# Patient Record
Sex: Male | Born: 1950 | Race: White | Hispanic: No | Smoking: Never smoker
Health system: Southern US, Community
[De-identification: ages and names within clinical notes are randomized; demographics above are authoritative.]

## PROBLEM LIST (undated history)

## (undated) DIAGNOSIS — R0602 Shortness of breath: Secondary | ICD-10-CM

## (undated) DIAGNOSIS — D649 Anemia, unspecified: Secondary | ICD-10-CM

## (undated) DIAGNOSIS — I499 Cardiac arrhythmia, unspecified: Secondary | ICD-10-CM

## (undated) DIAGNOSIS — F32A Depression, unspecified: Secondary | ICD-10-CM

## (undated) DIAGNOSIS — K219 Gastro-esophageal reflux disease without esophagitis: Secondary | ICD-10-CM

## (undated) DIAGNOSIS — I509 Heart failure, unspecified: Secondary | ICD-10-CM

## (undated) DIAGNOSIS — J449 Chronic obstructive pulmonary disease, unspecified: Secondary | ICD-10-CM

## (undated) DIAGNOSIS — M797 Fibromyalgia: Secondary | ICD-10-CM

## (undated) DIAGNOSIS — E039 Hypothyroidism, unspecified: Secondary | ICD-10-CM

## (undated) DIAGNOSIS — F329 Major depressive disorder, single episode, unspecified: Secondary | ICD-10-CM

## (undated) DIAGNOSIS — Z8719 Personal history of other diseases of the digestive system: Secondary | ICD-10-CM

## (undated) DIAGNOSIS — R51 Headache: Secondary | ICD-10-CM

## (undated) DIAGNOSIS — B20 Human immunodeficiency virus [HIV] disease: Secondary | ICD-10-CM

## (undated) DIAGNOSIS — Z5189 Encounter for other specified aftercare: Secondary | ICD-10-CM

## (undated) HISTORY — PX: HERNIA REPAIR: SHX51

## (undated) HISTORY — PX: APPENDECTOMY: SHX54

## (undated) HISTORY — PX: ABDOMINAL SURGERY: SHX537

## (undated) HISTORY — PX: BELOW KNEE LEG AMPUTATION: SUR23

## (undated) HISTORY — PX: CHOLECYSTECTOMY: SHX55

## (undated) HISTORY — PX: JOINT REPLACEMENT: SHX530

---

## 2012-09-05 HISTORY — PX: NISSEN FUNDOPLICATION: SHX2091

## 2013-06-07 ENCOUNTER — Inpatient Hospital Stay (HOSPITAL_COMMUNITY)
Admission: EM | Admit: 2013-06-07 | Discharge: 2013-06-13 | DRG: 974 | Disposition: A | Discharge status: Home / Self Care | Payer: Medicare Other | Attending: Internal Medicine | Admitting: Internal Medicine

## 2013-06-07 ENCOUNTER — Emergency Department (HOSPITAL_COMMUNITY): Payer: Medicare Other

## 2013-06-07 ENCOUNTER — Encounter (HOSPITAL_COMMUNITY): Payer: Self-pay | Admitting: Emergency Medicine

## 2013-06-07 DIAGNOSIS — I4891 Unspecified atrial fibrillation: Secondary | ICD-10-CM | POA: Diagnosis present

## 2013-06-07 DIAGNOSIS — S88119A Complete traumatic amputation at level between knee and ankle, unspecified lower leg, initial encounter: Secondary | ICD-10-CM

## 2013-06-07 DIAGNOSIS — I509 Heart failure, unspecified: Secondary | ICD-10-CM | POA: Diagnosis present

## 2013-06-07 DIAGNOSIS — F329 Major depressive disorder, single episode, unspecified: Secondary | ICD-10-CM | POA: Diagnosis present

## 2013-06-07 DIAGNOSIS — J690 Pneumonitis due to inhalation of food and vomit: Secondary | ICD-10-CM | POA: Diagnosis present

## 2013-06-07 DIAGNOSIS — J9600 Acute respiratory failure, unspecified whether with hypoxia or hypercapnia: Secondary | ICD-10-CM | POA: Diagnosis present

## 2013-06-07 DIAGNOSIS — F3289 Other specified depressive episodes: Secondary | ICD-10-CM | POA: Diagnosis present

## 2013-06-07 DIAGNOSIS — J189 Pneumonia, unspecified organism: Secondary | ICD-10-CM | POA: Diagnosis present

## 2013-06-07 DIAGNOSIS — Z79899 Other long term (current) drug therapy: Secondary | ICD-10-CM

## 2013-06-07 DIAGNOSIS — D649 Anemia, unspecified: Secondary | ICD-10-CM | POA: Diagnosis present

## 2013-06-07 DIAGNOSIS — K219 Gastro-esophageal reflux disease without esophagitis: Secondary | ICD-10-CM | POA: Diagnosis present

## 2013-06-07 DIAGNOSIS — I442 Atrioventricular block, complete: Secondary | ICD-10-CM | POA: Diagnosis present

## 2013-06-07 DIAGNOSIS — B20 Human immunodeficiency virus [HIV] disease: Principal | ICD-10-CM | POA: Diagnosis present

## 2013-06-07 DIAGNOSIS — Z7982 Long term (current) use of aspirin: Secondary | ICD-10-CM

## 2013-06-07 DIAGNOSIS — E876 Hypokalemia: Secondary | ICD-10-CM | POA: Diagnosis present

## 2013-06-07 DIAGNOSIS — Z21 Asymptomatic human immunodeficiency virus [HIV] infection status: Secondary | ICD-10-CM | POA: Diagnosis present

## 2013-06-07 DIAGNOSIS — A419 Sepsis, unspecified organism: Secondary | ICD-10-CM | POA: Diagnosis present

## 2013-06-07 HISTORY — DX: Hypothyroidism, unspecified: E03.9

## 2013-06-07 HISTORY — DX: Chronic obstructive pulmonary disease, unspecified: J44.9

## 2013-06-07 HISTORY — DX: Anemia, unspecified: D64.9

## 2013-06-07 HISTORY — DX: Depression, unspecified: F32.A

## 2013-06-07 HISTORY — DX: Encounter for other specified aftercare: Z51.89

## 2013-06-07 HISTORY — DX: Heart failure, unspecified: I50.9

## 2013-06-07 HISTORY — DX: Major depressive disorder, single episode, unspecified: F32.9

## 2013-06-07 HISTORY — DX: Cardiac arrhythmia, unspecified: I49.9

## 2013-06-07 HISTORY — DX: Shortness of breath: R06.02

## 2013-06-07 HISTORY — DX: Gastro-esophageal reflux disease without esophagitis: K21.9

## 2013-06-07 HISTORY — DX: Headache: R51

## 2013-06-07 HISTORY — DX: Human immunodeficiency virus (HIV) disease: B20

## 2013-06-07 HISTORY — DX: Fibromyalgia: M79.7

## 2013-06-07 HISTORY — DX: Personal history of other diseases of the digestive system: Z87.19

## 2013-06-07 LAB — PRO B NATRIURETIC PEPTIDE: PRO B NATRI PEPTIDE: 121.5 pg/mL (ref 0–125)

## 2013-06-07 LAB — CBC
HEMATOCRIT: 33.3 % — AB (ref 39.0–52.0)
HEMATOCRIT: 28.6 % — AB (ref 39.0–52.0)
HEMOGLOBIN: 10.6 g/dL — AB (ref 13.0–17.0)
HEMOGLOBIN: 9.1 g/dL — AB (ref 13.0–17.0)
MCH: 27.2 pg (ref 26.0–34.0)
MCH: 27.3 pg (ref 26.0–34.0)
MCHC: 31.8 g/dL (ref 30.0–36.0)
MCHC: 31.8 g/dL (ref 30.0–36.0)
MCV: 85.6 fL (ref 78.0–100.0)
MCV: 85.9 fL (ref 78.0–100.0)
Platelets: 173 10*3/uL (ref 150–400)
Platelets: 162 10*3/uL (ref 150–400)
RBC: 3.89 MIL/uL — ABNORMAL LOW (ref 4.22–5.81)
RBC: 3.33 MIL/uL — ABNORMAL LOW (ref 4.22–5.81)
RDW: 14.7 % (ref 11.5–15.5)
RDW: 14.7 % (ref 11.5–15.5)
WBC: 10.7 10*3/uL — ABNORMAL HIGH (ref 4.0–10.5)
WBC: 10.5 10*3/uL (ref 4.0–10.5)

## 2013-06-07 LAB — URINALYSIS, ROUTINE W REFLEX MICROSCOPIC
BILIRUBIN URINE: NEGATIVE
Glucose, UA: NEGATIVE mg/dL
KETONES UR: NEGATIVE mg/dL
LEUKOCYTES UA: NEGATIVE
Nitrite: NEGATIVE
PH: 5.5 (ref 5.0–8.0)
Protein, ur: NEGATIVE mg/dL
Specific Gravity, Urine: 1.012 (ref 1.005–1.030)
Urobilinogen, UA: 0.2 mg/dL (ref 0.0–1.0)

## 2013-06-07 LAB — POCT I-STAT, CHEM 8
BUN: 15 mg/dL (ref 6–23)
CHLORIDE: 100 meq/L (ref 96–112)
CREATININE: 1.1 mg/dL (ref 0.50–1.35)
Calcium, Ion: 1.23 mmol/L (ref 1.13–1.30)
Glucose, Bld: 119 mg/dL — ABNORMAL HIGH (ref 70–99)
HCT: 34 % — ABNORMAL LOW (ref 39.0–52.0)
Hemoglobin: 11.6 g/dL — ABNORMAL LOW (ref 13.0–17.0)
Potassium: 4.2 mEq/L (ref 3.7–5.3)
Sodium: 136 mEq/L — ABNORMAL LOW (ref 137–147)
TCO2: 23 mmol/L (ref 0–100)

## 2013-06-07 LAB — BASIC METABOLIC PANEL
BUN: 15 mg/dL (ref 6–23)
CALCIUM: 8.7 mg/dL (ref 8.4–10.5)
CO2: 24 meq/L (ref 19–32)
Chloride: 101 mEq/L (ref 96–112)
Creatinine, Ser: 1.05 mg/dL (ref 0.50–1.35)
GFR calc Af Amer: 87 mL/min — ABNORMAL LOW (ref >90)
GFR, EST NON AFRICAN AMERICAN: 75 mL/min — AB (ref >90)
GLUCOSE: 112 mg/dL — AB (ref 70–99)
Potassium: 4.5 mEq/L (ref 3.7–5.3)
Sodium: 138 mEq/L (ref 137–147)

## 2013-06-07 LAB — INFLUENZA PANEL BY PCR (TYPE A & B)
H1N1FLUPCR: NOT DETECTED
Influenza A By PCR: NEGATIVE
Influenza B By PCR: NEGATIVE

## 2013-06-07 LAB — STREP PNEUMONIAE URINARY ANTIGEN: STREP PNEUMO URINARY ANTIGEN: NEGATIVE

## 2013-06-07 LAB — CG4 I-STAT (LACTIC ACID): Lactic Acid, Venous: 0.63 mmol/L (ref 0.5–2.2)

## 2013-06-07 LAB — CREATININE, SERUM: Creatinine, Ser: 0.85 mg/dL (ref 0.50–1.35)

## 2013-06-07 LAB — TROPONIN I: Troponin I: <0.3 ng/mL (ref <0.30)

## 2013-06-07 LAB — POCT I-STAT TROPONIN I: Troponin i, poc: 0 ng/mL (ref 0.00–0.08)

## 2013-06-07 LAB — LACTATE DEHYDROGENASE: LDH: 201 U/L (ref 94–250)

## 2013-06-07 LAB — URINE MICROSCOPIC-ADD ON

## 2013-06-07 MED ORDER — PIPERACILLIN-TAZOBACTAM 3.375 G IVPB
3.3750 g | Freq: Three times a day (TID) | INTRAVENOUS | Status: DC
  Filled 2013-06-07 (×2): qty 50

## 2013-06-07 MED ORDER — PIPERACILLIN-TAZOBACTAM 3.375 G IVPB: 3.3750 g | INTRAVENOUS | Status: DC

## 2013-06-07 MED ORDER — MORPHINE SULFATE 2 MG/ML IJ SOLN
2.0000 mg | INTRAMUSCULAR | Status: DC | PRN
  Administered 2013-06-09 – 2013-06-13 (×10): 2 mg via INTRAVENOUS
  Filled 2013-06-07 (×11): qty 1

## 2013-06-07 MED ORDER — SODIUM CHLORIDE 0.9 % IV SOLN: INTRAVENOUS | Status: DC

## 2013-06-07 MED ORDER — DEXTROSE 5 % IV SOLN
500.0000 mg | INTRAVENOUS | Status: DC
  Administered 2013-06-07 – 2013-06-09 (×3): 500 mg via INTRAVENOUS
  Filled 2013-06-07 (×5): qty 500

## 2013-06-07 MED ORDER — FLUOXETINE HCL 20 MG PO CAPS
40.0000 mg | ORAL_CAPSULE | Freq: Two times a day (BID) | ORAL | Status: DC
  Administered 2013-06-07 – 2013-06-13 (×11): 40 mg via ORAL
  Filled 2013-06-07 (×13): qty 2

## 2013-06-07 MED ORDER — EMTRICITABINE-TENOFOVIR DF 200-300 MG PO TABS
1.0000 | ORAL_TABLET | Freq: Every day | ORAL | Status: DC
  Administered 2013-06-07 – 2013-06-13 (×6): 1 via ORAL
  Filled 2013-06-07 (×8): qty 1

## 2013-06-07 MED ORDER — HYDROMORPHONE HCL PF 1 MG/ML IJ SOLN
1.0000 mg | Freq: Once | INTRAMUSCULAR | Status: AC
  Administered 2013-06-07: 1 mg via INTRAVENOUS
  Filled 2013-06-07: qty 1

## 2013-06-07 MED ORDER — VANCOMYCIN HCL IN DEXTROSE 1-5 GM/200ML-% IV SOLN
1000.0000 mg | Freq: Two times a day (BID) | INTRAVENOUS | Status: DC
  Filled 2013-06-07: qty 200

## 2013-06-07 MED ORDER — OXYCODONE HCL 5 MG PO TABS
15.0000 mg | ORAL_TABLET | Freq: Four times a day (QID) | ORAL | Status: DC | PRN
  Administered 2013-06-07 – 2013-06-13 (×14): 15 mg via ORAL
  Filled 2013-06-07 (×16): qty 3

## 2013-06-07 MED ORDER — LEVOTHYROXINE SODIUM 50 MCG PO TABS
50.0000 ug | ORAL_TABLET | Freq: Every day | ORAL | Status: DC
  Administered 2013-06-08 – 2013-06-13 (×5): 50 ug via ORAL
  Filled 2013-06-07 (×7): qty 1

## 2013-06-07 MED ORDER — PANTOPRAZOLE SODIUM 40 MG PO TBEC
40.0000 mg | DELAYED_RELEASE_TABLET | Freq: Two times a day (BID) | ORAL | Status: DC
  Administered 2013-06-07 – 2013-06-13 (×11): 40 mg via ORAL
  Filled 2013-06-07 (×11): qty 1

## 2013-06-07 MED ORDER — ENOXAPARIN SODIUM 40 MG/0.4ML ~~LOC~~ SOLN
40.0000 mg | SUBCUTANEOUS | Status: DC
  Administered 2013-06-07 – 2013-06-12 (×6): 40 mg via SUBCUTANEOUS
  Filled 2013-06-07 (×7): qty 0.4

## 2013-06-07 MED ORDER — ASPIRIN 325 MG PO TABS
325.0000 mg | ORAL_TABLET | Freq: Every day | ORAL | Status: DC
  Administered 2013-06-07 – 2013-06-13 (×6): 325 mg via ORAL
  Filled 2013-06-07 (×7): qty 1

## 2013-06-07 MED ORDER — BUPROPION HCL 100 MG PO TABS
100.0000 mg | ORAL_TABLET | Freq: Every day | ORAL | Status: DC
  Administered 2013-06-09 – 2013-06-13 (×5): 100 mg via ORAL
  Filled 2013-06-07 (×6): qty 1

## 2013-06-07 MED ORDER — SUCRALFATE 1 GM/10ML PO SUSP
1.0000 g | Freq: Three times a day (TID) | ORAL | Status: DC
Start: 2013-06-07 — End: 2013-06-13
  Administered 2013-06-07 – 2013-06-13 (×22): 1 g via ORAL
  Filled 2013-06-07 (×30): qty 10

## 2013-06-07 MED ORDER — FLUDROCORTISONE ACETATE 0.1 MG PO TABS
0.1000 mg | ORAL_TABLET | Freq: Every day | ORAL | Status: DC
  Administered 2013-06-07 – 2013-06-13 (×6): 0.1 mg via ORAL
  Filled 2013-06-07 (×7): qty 1

## 2013-06-07 MED ORDER — OXYBUTYNIN CHLORIDE ER 10 MG PO TB24
10.0000 mg | ORAL_TABLET | Freq: Two times a day (BID) | ORAL | Status: DC
  Administered 2013-06-07 – 2013-06-13 (×12): 10 mg via ORAL
  Filled 2013-06-07 (×13): qty 1

## 2013-06-07 MED ORDER — SODIUM CHLORIDE 0.9 % IV BOLUS (SEPSIS)
2000.0000 mL | Freq: Once | INTRAVENOUS | Status: AC
  Administered 2013-06-07: 1000 mL via INTRAVENOUS

## 2013-06-07 MED ORDER — TAMSULOSIN HCL 0.4 MG PO CAPS
0.4000 mg | ORAL_CAPSULE | Freq: Two times a day (BID) | ORAL | Status: DC
  Administered 2013-06-07 – 2013-06-13 (×11): 0.4 mg via ORAL
  Filled 2013-06-07 (×13): qty 1

## 2013-06-07 MED ORDER — PIPERACILLIN-TAZOBACTAM 3.375 G IVPB
3.3750 g | Freq: Three times a day (TID) | INTRAVENOUS | Status: DC
  Administered 2013-06-07 – 2013-06-13 (×17): 3.375 g via INTRAVENOUS
  Filled 2013-06-07 (×25): qty 50

## 2013-06-07 MED ORDER — RALTEGRAVIR POTASSIUM 400 MG PO TABS
400.0000 mg | ORAL_TABLET | Freq: Two times a day (BID) | ORAL | Status: DC
  Administered 2013-06-07 – 2013-06-13 (×12): 400 mg via ORAL
  Filled 2013-06-07 (×13): qty 1

## 2013-06-07 MED ORDER — METOCLOPRAMIDE HCL 5 MG PO TABS
5.0000 mg | ORAL_TABLET | Freq: Two times a day (BID) | ORAL | Status: DC
  Administered 2013-06-07 – 2013-06-13 (×11): 5 mg via ORAL
  Filled 2013-06-07 (×13): qty 1

## 2013-06-07 MED ORDER — VANCOMYCIN HCL 10 G IV SOLR
1500.0000 mg | Freq: Once | INTRAVENOUS | Status: DC
  Administered 2013-06-07: 1500 mg via INTRAVENOUS
  Filled 2013-06-07: qty 1500

## 2013-06-07 MED ORDER — TRAZODONE HCL 100 MG PO TABS
100.0000 mg | ORAL_TABLET | Freq: Every day | ORAL | Status: DC
  Administered 2013-06-07 – 2013-06-12 (×6): 100 mg via ORAL
  Filled 2013-06-07 (×7): qty 1

## 2013-06-07 MED ORDER — QUETIAPINE FUMARATE 100 MG PO TABS
100.0000 mg | ORAL_TABLET | Freq: Every day | ORAL | Status: DC
  Administered 2013-06-07 – 2013-06-12 (×6): 100 mg via ORAL
  Filled 2013-06-07 (×7): qty 1

## 2013-06-07 MED ORDER — PREGABALIN 25 MG PO CAPS
75.0000 mg | ORAL_CAPSULE | Freq: Two times a day (BID) | ORAL | Status: DC
  Administered 2013-06-07 – 2013-06-13 (×11): 75 mg via ORAL
  Filled 2013-06-07 (×4): qty 1
  Filled 2013-06-07: qty 3
  Filled 2013-06-07 (×11): qty 1

## 2013-06-07 MED ORDER — SODIUM CHLORIDE 0.9 % IV SOLN
INTRAVENOUS | Status: DC
  Administered 2013-06-07: 23:00:00 via INTRAVENOUS

## 2013-06-07 NOTE — ED Notes (Signed)
MD at bedside. 

## 2013-06-07 NOTE — ED Notes (Addendum)
Report given to Tera Partridge4E Troyce, RN

## 2013-06-07 NOTE — ED Notes (Signed)
Per lab ordered I-Stat 8 because working on machine.

## 2013-06-07 NOTE — Progress Notes (Signed)
Pt arrived from ED per stretcher accompanied by tech and mother VS done and placed on telemetry monitor. Pt oriented to room. Aware sputum and urine needed for send out.

## 2013-06-07 NOTE — Progress Notes (Signed)
ANTIBIOTIC CONSULT NOTE - INITIAL  Pharmacy Consult:  Vancomycin / Zosyn Indication:  PNA  Allergies  Allergen Reactions  . Bee Venom     anaphalaxis    Patient Measurements: Height: 5\' 10"  (177.8 cm) Weight: 180 lb (81.647 kg) IBW/kg (Calculated) : 73  Vital Signs: Temp: 99.1 F (37.3 C) (01/01 1226) Temp src: Oral (01/01 1226) BP: 90/63 mmHg (01/01 1226) Pulse Rate: 97 (01/01 1226)  Labs:  Recent Labs  06/07/13 1232 06/07/13 1250  WBC 10.7*  --   HGB 10.6* 11.6*  PLT 173  --   CREATININE  --  1.10   Estimated Creatinine Clearance: 73.7 ml/min (by C-G formula based on Cr of 1.1). No results found for this basename: VANCOTROUGH, VANCOPEAK, VANCORANDOM, GENTTROUGH, GENTPEAK, GENTRANDOM, TOBRATROUGH, TOBRAPEAK, TOBRARND, AMIKACINPEAK, AMIKACINTROU, AMIKACIN,  in the last 72 hours   Microbiology: No results found for this or any previous visit (from the past 720 hour(s)).  Medical History: Past Medical History  Diagnosis Date  . AIDS   . CHF (congestive heart failure)   . Blood transfusion without reported diagnosis   . Depression       Assessment: 89 YOM with history of HIV/AIDs presented with SOB s/p Levaquin treatment x 2 days.  Pharmacy consulted to manage vancomycin and Zosyn for PNA.  Baseline labs reviewed.   Goal of Therapy:  Vancomycin trough level 15-20 mcg/ml   Plan:  - Vanc 1500mg  IV x 1, then 1gm IV Q12H - Zosyn 3.375gm IV Q8H, 4 hr infusion - Monitor renal fxn, clinical course, vanc trough as indicated - F/U the need for empiric Bactrim    Pace Lamadrid D. Laney Potashang, PharmD, BCPS Pager:  [phone] 06/07/2013, 1:50 PM

## 2013-06-07 NOTE — Progress Notes (Signed)
Call to IV team to notify for 2 nd IV site since pt is getting 3 IV antibiotics.

## 2013-06-07 NOTE — ED Notes (Addendum)
MD at bedside. Discussed admission w/ pt.

## 2013-06-07 NOTE — ED Provider Notes (Signed)
CSN: [id number]     Arrival date & time 06/07/13  1203 History   First MD Initiated Contact with Patient 06/07/13 1236     Chief Complaint  Patient presents with  . Shortness of Breath   (Consider location/radiation/quality/duration/timing/severity/associated sxs/prior Treatment) Patient is a 63 y.o. male presenting with shortness of breath.  Shortness of Breath  Pt with history of HIV/AIDS, last CD4 was >600 per patient about 2 months ago. Here visiting from [place]. Reports 2 days ago he began to have fever and productive cough. He has had aspiration pneumonia many times in the past and this feels similar. He reports fundoplication was done several months ago to help prevent aspiration but reports he had an aspiration event about a week ago. He also reports general malaise, myalgias and burning with urination.   Past Medical History  Diagnosis Date  . AIDS   . CHF (congestive heart failure)   . Blood transfusion without reported diagnosis   . Depression    Past Surgical History  Procedure Laterality Date  . Abdominal surgery    . Cholecystectomy    . Appendectomy    . Hernia repair    . Joint replacement    . Below knee leg amputation     History reviewed. No pertinent family history. History  Substance Use Topics  . Smoking status: Never Smoker   . Smokeless tobacco: Not on file  . Alcohol Use: No    Review of Systems  Respiratory: Positive for shortness of breath.    All other systems reviewed and are negative except as noted in HPI.   Allergies  Bee venom  Home Medications  No current outpatient prescriptions on file. BP 81/53  Pulse 82  Temp(Src) 99.1 F (37.3 C) (Oral)  Resp 18  Ht 5\' 10"  (1.778 m)  Wt 180 lb (81.647 kg)  BMI 25.83 kg/m2  SpO2 97% Physical Exam  Nursing note and vitals reviewed. Constitutional: He is oriented to person, place, and time. He appears well-developed and well-nourished.  HENT:  Head: Normocephalic and atraumatic.   Dry mouth  Eyes: EOM are normal. Pupils are equal, round, and reactive to light.  Neck: Normal range of motion. Neck supple.  Cardiovascular: Normal rate, normal heart sounds and intact distal pulses.   Pulmonary/Chest: Effort normal. He has no wheezes. He has rales (L base).  Abdominal: Bowel sounds are normal. He exhibits no distension. There is no tenderness.  Musculoskeletal: Normal range of motion. He exhibits no edema and no tenderness.  Neurological: He is alert and oriented to person, place, and time. He has normal strength. No cranial nerve deficit or sensory deficit.  Skin: Skin is warm and dry. No rash noted.  Psychiatric: He has a normal mood and affect.    ED Course  Procedures (including critical care time) Labs Review Labs Reviewed  CBC - Abnormal; Notable for the following:    WBC 10.7 (*)    RBC 3.89 (*)    Hemoglobin 10.6 (*)    HCT 33.3 (*)    All other components within normal limits  BASIC METABOLIC PANEL - Abnormal; Notable for the following:    Glucose, Bld 112 (*)    GFR calc non Af Amer 75 (*)    GFR calc Af Amer 87 (*)    All other components within normal limits  POCT I-STAT, CHEM 8 - Abnormal; Notable for the following:    Sodium 136 (*)    Glucose, Bld 119 (*)  Hemoglobin 11.6 (*)    HCT 34.0 (*)    All other components within normal limits  CULTURE, BLOOD (ROUTINE X 2)  CULTURE, BLOOD (ROUTINE X 2)  LACTATE DEHYDROGENASE  TROPONIN I  INFLUENZA PANEL BY PCR  URINALYSIS, ROUTINE W REFLEX MICROSCOPIC  POCT I-STAT TROPONIN I  CG4 I-STAT (LACTIC ACID)   Imaging Review Dg Chest 2 View  06/07/2013   CLINICAL DATA:  Fever and cough.  EXAM: CHEST  2 VIEW  COMPARISON:  None.  FINDINGS: Two views of the chest demonstrate mild elevation of the right hemidiaphragm. There are streaky densities at the left lung base which could an infectious process. Few densities near the right costophrenic angle may represent atelectasis or scarring. Surgical clips in  the right infrahilar region. There are streaky densities in the right superior hilar region that may represent overlying bone structures but nonspecific. No definite pleural effusions.  IMPRESSION: Streaky densities at the left lung base are concerning for an acute infectious or inflammatory process but difficult to exclude chronic changes at this location.  Postoperative changes in the right hemithorax with mild elevation of the right hemidiaphragm. Streaky densities at the right costophrenic angle and right suprahilar region are nonspecific.  Recommend short-term followup to evaluate for resolution of the densities at the left lung base. The patient may need a chest CT to follow the streaky densities in the right lung.   Electronically Signed   By: Richarda OverlieAdam  Henn M.D.   On: 06/07/2013 13:37    EKG Interpretation    Date/Time:  Thursday June 07 2013 12:08:00 EST Ventricular Rate:  107 PR Interval:  186 QRS Duration: 76 QT Interval:  324 QTC Calculation: 432 R Axis:   10 Text Interpretation:  Sinus tachycardia Nonspecific ST and T wave abnormality Abnormal ECG No significant change since last tracing Confirmed by SHELDON  MD, CHARLES ([id number]) on 06/07/2013 12:15:19 PM            MDM   1. CAP (community acquired pneumonia)   2. Sepsis    Pt meets criteria for sepsis with transient hypotension, no signs of shock. Plan Admission for CAP, not improving with Levaquin.     Charles B. Bernette MayersSheldon, MD 06/07/13 [phone]

## 2013-06-07 NOTE — ED Notes (Signed)
Pt reports fever to 103 this AM, SOB, prod cough states he has AIDs. PMD in [place]. Called in script for levaquin which patient has been taking for two days. Pt tachypneic. Sat 92% Ra.

## 2013-06-07 NOTE — H&P (Signed)
[facility] History and Physical  [name] WUJ:[id number] DOB: 09-Nov-1950 DOA: 06/07/2013  Referring physician:  PCP: No primary provider on file.   Chief Complaint: Cough/Shortness of Breath  HPI: [name] is a 63 y.o. male with a past medical history of HIV, last CD4 count greater than 600 per patient several months ago, history of severe reflux leading to aspiration pneumonias in the past, status post fundoplication procedure performed in [place] this year, who is currently in the area visiting his parents. He presents to the emergency department today with complaints of cough, fever reporting a temperature of 103 at home this morning, green sputum production, and shortness of breath which has progressively worsened over the past 4 days. He also complains of associated chest pain with a deep inspiration and cough, generalized weakness, malaise, fatigue, and overall doing poorly at home. His physicians in [place] called in a prescription for levofloxacin which he took prior to this presentation. Chest x-ray performed in the emergency department today showed streaky densities in the left lung base concerning for an acute infectious or inflammatory process.                                                                                                        Review of Systems:  Constitutional:  Positive for night sweats, Fevers, chills, fatigue.  HEENT:  No headaches, Difficulty swallowing,Tooth/dental problems,Sore throat,  No sneezing, itching, ear ache, nasal congestion, post nasal drip,  Cardio-vascular:  Orthopnea, PND, swelling in lower extremities, anasarca, dizziness, palpitations, positive for chest pain GI:  No heartburn, indigestion, abdominal pain, nausea, vomiting, diarrhea, change in bowel habits, loss of appetite  Resp:  Positive for shortness of breath with exertion and at rest. He reported excess mucus, productive cough, No coughing up of blood.No  change in color of mucus.No wheezing.No chest wall deformity  Skin:  no rash or lesions.  GU:  no dysuria, change in color of urine, no urgency or frequency. No flank pain.  Musculoskeletal:  No joint pain or swelling. No decreased range of motion. No back pain.  Psych:  No change in mood or affect. No depression or anxiety. No memory loss.   Past Medical History  Diagnosis Date  . AIDS   . CHF (congestive heart failure)   . Blood transfusion without reported diagnosis   . Depression    Past Surgical History  Procedure Laterality Date  . Abdominal surgery    . Cholecystectomy    . Appendectomy    . Hernia repair    . Joint replacement    . Below knee leg amputation     Social History:  reports that he has never smoked. He does not have any smokeless tobacco history on file. He reports that he does not drink alcohol or use illicit drugs.  Allergies  Allergen Reactions  . Bee Venom     anaphalaxis    History reviewed. No pertinent family history.   Prior to Admission medications   Medication Sig Start Date End Date Taking? Authorizing Provider  aspirin 325 MG tablet  Take 325 mg by mouth daily.   Yes Historical Provider, MD  B Complex-Biotin-FA (HM VITAMIN B100 COMPLEX PO) Take 1 tablet by mouth every morning.   Yes Historical Provider, MD  buPROPion (WELLBUTRIN) 100 MG tablet Take 100 mg by mouth every morning.   Yes Historical Provider, MD  clotrimazole-betamethasone (LOTRISONE) cream Apply 1 application topically as needed.   Yes Historical Provider, MD  diphenoxylate-atropine (LOMOTIL) 2.5-0.025 MG per tablet Take 1 tablet by mouth 3 (three) times daily as needed for diarrhea or loose stools (sometimes takes more than 3 times daily).   Yes Historical Provider, MD  emtricitabine-tenofovir (TRUVADA) 200-300 MG per tablet Take 1 tablet by mouth daily.   Yes Historical Provider, MD  EPINEPHrine (EPIPEN 2-PAK) 0.3 mg/0.3 mL SOAJ injection Inject 0.3 mg into the muscle once as  needed (anaphylaxis).   Yes Historical Provider, MD  fludrocortisone (FLORINEF) 0.1 MG tablet Take 0.1 mg by mouth daily.   Yes Historical Provider, MD  FLUoxetine (PROZAC) 40 MG capsule Take 40 mg by mouth 2 (two) times daily.   Yes Historical Provider, MD  folic acid (FOLVITE) 1 MG tablet Take 1 mg by mouth daily.   Yes Historical Provider, MD  furosemide (LASIX) 40 MG tablet Take 40 mg by mouth daily.   Yes Historical Provider, MD  GuaiFENesin (MUCINEX PO) Take 1 tablet by mouth daily. Chest congestion   Yes Historical Provider, MD  Ipratropium-Albuterol (COMBIVENT RESPIMAT) 20-100 MCG/ACT AERS respimat Inhale 1 puff into the lungs 4 (four) times daily.   Yes Historical Provider, MD  levofloxacin (LEVAQUIN) 500 MG tablet Take 500 mg by mouth daily.   Yes Historical Provider, MD  levothyroxine (SYNTHROID, LEVOTHROID) 50 MCG tablet Take 50 mcg by mouth daily before breakfast.   Yes Historical Provider, MD  loratadine (CLARITIN) 10 MG tablet Take 10 mg by mouth daily.   Yes Historical Provider, MD  Melatonin 3 MG TABS Take 1 tablet by mouth at bedtime.   Yes Historical Provider, MD  metoCLOPramide (REGLAN) 5 MG tablet Take 5 mg by mouth 2 (two) times daily.   Yes Historical Provider, MD  ondansetron (ZOFRAN) 4 MG tablet Take 8 mg by mouth every 8 (eight) hours as needed for nausea or vomiting.   Yes Historical Provider, MD  oxybutynin (DITROPAN-XL) 10 MG 24 hr tablet Take 10 mg by mouth 2 (two) times daily.   Yes Historical Provider, MD  OxyCODONE (OXYCONTIN) 80 mg T12A 12 hr tablet Take 80 mg by mouth 2 (two) times daily.   Yes Historical Provider, MD  oxyCODONE (ROXICODONE) 15 MG immediate release tablet Take 15 mg by mouth every 8 (eight) hours as needed for pain (breakthrough pain).   Yes Historical Provider, MD  pantoprazole (PROTONIX) 40 MG tablet Take 40 mg by mouth 2 (two) times daily.   Yes Historical Provider, MD  potassium chloride SA (K-DUR,KLOR-CON) 20 MEQ tablet Take 20 mEq by mouth 2  (two) times daily.   Yes Historical Provider, MD  pregabalin (LYRICA) 75 MG capsule Take 75 mg by mouth 2 (two) times daily.   Yes Historical Provider, MD  QUEtiapine (SEROQUEL) 100 MG tablet Take 100 mg by mouth at bedtime.   Yes Historical Provider, MD  raltegravir (ISENTRESS) 400 MG tablet Take 400 mg by mouth 2 (two) times daily.   Yes Historical Provider, MD  ranitidine (ZANTAC) 150 MG tablet Take 150 mg by mouth 2 (two) times daily.   Yes Historical Provider, MD  sucralfate (CARAFATE) 1 GM/10ML suspension Take  1 g by mouth 4 (four) times daily as needed.   Yes Historical Provider, MD  tamsulosin (FLOMAX) 0.4 MG CAPS capsule Take 0.4 mg by mouth 2 (two) times daily.   Yes Historical Provider, MD  traZODone (DESYREL) 100 MG tablet Take 100 mg by mouth at bedtime.   Yes Historical Provider, MD  Vitamin D, Ergocalciferol, (DRISDOL) 50000 UNITS CAPS capsule Take 50,000 Units by mouth every 7 (seven) days. Takes on fridays   Yes Historical Provider, MD   Physical Exam: Filed Vitals:   06/07/13 1430  BP: 109/73  Pulse: 80  Temp:   Resp:     BP 109/73  Pulse 80  Temp(Src) 99.1 F (37.3 C) (Oral)  Resp 18  Ht 5\' 10"  (1.778 m)  Wt 81.647 kg (180 lb)  BMI 25.83 kg/m2  SpO2 97%  General:  Ill-appearing, mild distress. Awake alert oriented, mentating well. Eyes: PERRL, normal lids, irises & conjunctiva ENT: grossly normal hearing, lips & tongue Neck: no LAD, masses or thyromegaly Cardiovascular: RRR, no m/r/g. No LE edema. Telemetry: SR, no arrhythmias  Respiratory: Extensive rhonchi over left hemithorax, positive crackles, I do not auscultate wheezing or rales. Abdomen: soft, ntnd Skin: no rash or induration seen on limited exam Musculoskeletal: Patient having prosthesis to right lower extremity, left lower extremity have an old scars over ankle. Psychiatric: grossly normal mood and affect, speech fluent and appropriate Neurologic: grossly non-focal.          Labs on Admission:   Basic Metabolic Panel:  Recent Labs Lab 06/07/13 1232 06/07/13 1250  NA 138 136*  K 4.5 4.2  CL 101 100  CO2 24  --   GLUCOSE 112* 119*  BUN 15 15  CREATININE 1.05 1.10  CALCIUM 8.7  --    Liver Function Tests: No results found for this basename: AST, ALT, ALKPHOS, BILITOT, PROT, ALBUMIN,  in the last 168 hours No results found for this basename: LIPASE, AMYLASE,  in the last 168 hours No results found for this basename: AMMONIA,  in the last 168 hours CBC:  Recent Labs Lab 06/07/13 1232 06/07/13 1250  WBC 10.7*  --   HGB 10.6* 11.6*  HCT 33.3* 34.0*  MCV 85.6  --   PLT 173  --    Cardiac Enzymes:  Recent Labs Lab 06/07/13 1239  TROPONINI <0.30    BNP (last 3 results) No results found for this basename: PROBNP,  in the last 8760 hours CBG: No results found for this basename: GLUCAP,  in the last 168 hours  Radiological Exams on Admission: Dg Chest 2 View  06/07/2013   CLINICAL DATA:  Fever and cough.  EXAM: CHEST  2 VIEW  COMPARISON:  None.  FINDINGS: Two views of the chest demonstrate mild elevation of the right hemidiaphragm. There are streaky densities at the left lung base which could an infectious process. Few densities near the right costophrenic angle may represent atelectasis or scarring. Surgical clips in the right infrahilar region. There are streaky densities in the right superior hilar region that may represent overlying bone structures but nonspecific. No definite pleural effusions.  IMPRESSION: Streaky densities at the left lung base are concerning for an acute infectious or inflammatory process but difficult to exclude chronic changes at this location.  Postoperative changes in the right hemithorax with mild elevation of the right hemidiaphragm. Streaky densities at the right costophrenic angle and right suprahilar region are nonspecific.  Recommend short-term followup to evaluate for resolution of the densities at  the left lung base. The patient may need  a chest CT to follow the streaky densities in the right lung.   Electronically Signed   By: Richarda Overlie M.D.   On: 06/07/2013 13:37    EKG: Independently reviewed. Sinus tachycardia   Assessment/Plan Active Problems:   Sepsis   CAP (community acquired pneumonia)   Aspiration pneumonia   HIV (human immunodeficiency virus infection)   Pneumonia   1. Sepsis, present on admission, evidenced by hypotension with a blood pressure of 81/53, respiratory rate of 24 and a pulse of 105. He reported having a temperature of 103 this morning. Source of infection likely community acquire pneumonia versus aspiration pneumonia given his history. Will obtain blood cultures and sputum cultures, place and on the pneumonia protocol, start empiric IV antibiotic therapy with Zosyn and azithromycin. Continue supportive care, IV fluid resuscitation. 2. Community acquire pneumonia versus aspiration pneumonia. Patient reporting a history of severe reflux which has led to multiple episodes of aspiration pneumonias in the past. Chest x-ray showing infiltrate involving left lower lung. This could also be secondary to a community acquire pneumonia. Will start empiric IV antibiotic therapy with Zosyn for expanded coverage in setting of aspiration pneumonia. I will also start azithromycin for atypical coverage. Followup on blood cultures and sputum cultures. 3. Respiratory failure. Evidence by respiratory of 24, patient's noted to be in respiratory distress and having labored breathing in the emergency department, likely secondary to pneumonia. Provide supplemental oxygen. Monitor closely overnight with telemetry.  4. HIV. He states having last CD4 count greater than 600 several months ago. Will continue anti-retroviral therapy with Truvada and Raltegravir.  5. Severe gastroesophageal reflux disease. Continue Protonix 40 mg by mouth twice a day and Carafate. Status post fundoplication.  6. Nutrition. Regular diet 7. DVT  prophylaxis. Lovenox  Code Status: Full Code Family Communication: I spoke with patient's mother present at bedside Disposition Plan: I anticipate he will require greater than 2 nights hospitalization  Time spent: 70 minutes  Jeralyn Bennett [facility] Pager [phone]

## 2013-06-07 NOTE — ED Notes (Signed)
Phlebotomy notified of Blood Cultures to be obtained. States will obtain cultures.

## 2013-06-07 NOTE — ED Notes (Signed)
Pt made aware of the bed assignment. Understands admission.

## 2013-06-07 NOTE — ED Notes (Signed)
Lab at bedside for cultures.

## 2013-06-07 NOTE — ED Notes (Addendum)
Consulted MD Renae GlossShelton about blood cultures. Orders to be entered.

## 2013-06-08 ENCOUNTER — Encounter (HOSPITAL_COMMUNITY): Payer: Self-pay | Admitting: General Practice

## 2013-06-08 DIAGNOSIS — I442 Atrioventricular block, complete: Secondary | ICD-10-CM

## 2013-06-08 DIAGNOSIS — I059 Rheumatic mitral valve disease, unspecified: Secondary | ICD-10-CM

## 2013-06-08 DIAGNOSIS — J189 Pneumonia, unspecified organism: Secondary | ICD-10-CM

## 2013-06-08 LAB — LEGIONELLA ANTIGEN, URINE: LEGIONELLA ANTIGEN, URINE: NEGATIVE

## 2013-06-08 LAB — BASIC METABOLIC PANEL
BUN: 10 mg/dL (ref 6–23)
CALCIUM: 8 mg/dL — AB (ref 8.4–10.5)
CO2: 21 meq/L (ref 19–32)
Chloride: 111 mEq/L (ref 96–112)
Creatinine, Ser: 0.8 mg/dL (ref 0.50–1.35)
GFR calc Af Amer: >90 mL/min (ref >90)
Glucose, Bld: 121 mg/dL — ABNORMAL HIGH (ref 70–99)
Potassium: 3.3 mEq/L — ABNORMAL LOW (ref 3.7–5.3)
SODIUM: 146 meq/L (ref 137–147)

## 2013-06-08 LAB — CORTISOL: CORTISOL PLASMA: 11.8 ug/dL

## 2013-06-08 LAB — CBC
HCT: 29.5 % — ABNORMAL LOW (ref 39.0–52.0)
Hemoglobin: 9.1 g/dL — ABNORMAL LOW (ref 13.0–17.0)
MCH: 26.8 pg (ref 26.0–34.0)
MCHC: 30.8 g/dL (ref 30.0–36.0)
MCV: 86.8 fL (ref 78.0–100.0)
PLATELETS: 151 10*3/uL (ref 150–400)
RBC: 3.4 MIL/uL — ABNORMAL LOW (ref 4.22–5.81)
RDW: 14.9 % (ref 11.5–15.5)
WBC: 5.8 10*3/uL (ref 4.0–10.5)

## 2013-06-08 LAB — MRSA PCR SCREENING: MRSA BY PCR: NEGATIVE

## 2013-06-08 LAB — TSH: TSH: 3.584 u[IU]/mL (ref 0.350–4.500)

## 2013-06-08 MED ORDER — POTASSIUM CHLORIDE IN NACL 20-0.9 MEQ/L-% IV SOLN
INTRAVENOUS | Status: DC
  Administered 2013-06-08: 100 mL via INTRAVENOUS
  Administered 2013-06-09 (×2): via INTRAVENOUS
  Administered 2013-06-09: 100 mL via INTRAVENOUS
  Filled 2013-06-08 (×6): qty 1000

## 2013-06-08 MED ORDER — ONDANSETRON HCL 4 MG/2ML IJ SOLN
4.0000 mg | Freq: Four times a day (QID) | INTRAMUSCULAR | Status: DC | PRN
  Administered 2013-06-08 – 2013-06-12 (×6): 4 mg via INTRAVENOUS
  Filled 2013-06-08 (×6): qty 2

## 2013-06-08 MED ORDER — GUAIFENESIN-DM 100-10 MG/5ML PO SYRP
5.0000 mL | ORAL_SOLUTION | ORAL | Status: DC | PRN
  Administered 2013-06-08 – 2013-06-09 (×2): 5 mL via ORAL
  Filled 2013-06-08 (×2): qty 5

## 2013-06-08 MED ORDER — BENZONATATE 100 MG PO CAPS
200.0000 mg | ORAL_CAPSULE | Freq: Three times a day (TID) | ORAL | Status: DC | PRN
  Filled 2013-06-08 (×3): qty 2

## 2013-06-08 NOTE — Progress Notes (Signed)
Pt. With BP of 87/52 and 92/59. On call MD, Maretta BeesJ. McClung, notified via text page. Pt. Also with constant, dry non-productive cough. Pt. Requesting cough medication. RN will continue to monitor. Henrietta Cieslewicz, Cheryll DessertKaren Cherrell

## 2013-06-08 NOTE — Progress Notes (Signed)
  Echocardiogram 2D Echocardiogram has been performed.  [name], [name] 06/08/2013, 4:54 PM

## 2013-06-08 NOTE — Progress Notes (Signed)
[facility]. Talked with pt after reviewing strips and chart. PA ready to leave the floor at 10:12 when pt began throwing up undigested food and went in CHB rate 39. Alert pt placed on pacing pads but not sustained. ECG tracing noted and saved in chart. Put in for transfer to higher level of care.

## 2013-06-08 NOTE — Progress Notes (Signed)
Call to Hospitalist Dr York SpanielBuriev to make aware of pt's condition asymptomatic as this time. Made aware of EKG results. MD called cardiology consult. Kept pt updated.
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
## 2013-06-08 NOTE — Consult Note (Signed)
ELECTROPHYSIOLOGY CONSULT NOTE    Referring Physician:  Dr Eden EmmsNishan  Admit Date: 06/07/2013  Reason for consultation:  Transient AV block  Heber CarolinaMichael Webb is a 63 y.o. male with a h/o HIV, prior CHF and Afib (he says in 2005 but not recently) who was admitted with pneumonia.  He reports progressive SOB and cough.  His cough has become quite productive and has lead to post tussive emesis on several occasions.  Early this am (5:30), he was observed to have transient AV block on telemetry.  He does not recall events related to this.  He is not sure if he was coughing and thinks that he may have been sleeping.  Subsequently, around 10am, he had another episode of transient AV block.  He is quite certain that he had a very forceful cough which lead to gagging and post tussive emesis.  This proceeded transient AV block.  He reports that after coughing/ gagging/ throwing up that nurses came running in to see if he was ok because of his transient av block.  He denies presyncope or syncope with this event.  He has a h/o severe GERD s/p Nissen fundoplication earlier this year.  He has a h/o postural dizziness chronically but denies presyncope or syncope. He is unaware of any other issues with AV block.  Past Medical History  Diagnosis Date  . AIDS   . CHF (congestive heart failure)   . Blood transfusion without reported diagnosis   . Depression   . Dysrhythmia     HX OF ATRIAL FIB   COMPLETE HEART BLOCK   06/2013  . Hypothyroidism   . COPD (chronic obstructive pulmonary disease)   . Shortness of breath   . GERD (gastroesophageal reflux disease)   . H/O hiatal hernia   . Headache(784.0)   . Fibromyalgia   . Anemia    Past Surgical History  Procedure Laterality Date  . Abdominal surgery    . Cholecystectomy    . Appendectomy    . Hernia repair    . Below knee leg amputation    . Joint replacement    . Nissen fundoplication  09/2012    . aspirin  325 mg Oral Daily  . azithromycin  500 mg  Intravenous Q24H  . buPROPion  100 mg Oral Daily  . emtricitabine-tenofovir  1 tablet Oral Daily  . enoxaparin (LOVENOX) injection  40 mg Subcutaneous Q24H  . fludrocortisone  0.1 mg Oral Daily  . FLUoxetine  40 mg Oral BID  . levothyroxine  50 mcg Oral QAC breakfast  . metoCLOPramide  5 mg Oral BID  . oxybutynin  10 mg Oral BID  . pantoprazole  40 mg Oral BID  . piperacillin-tazobactam (ZOSYN)  IV  3.375 g Intravenous Q8H  . pregabalin  75 mg Oral BID  . QUEtiapine  100 mg Oral QHS  . raltegravir  400 mg Oral BID  . sucralfate  1 g Oral TID WC & HS  . tamsulosin  0.4 mg Oral BID  . traZODone  100 mg Oral QHS   . 0.9 % NaCl with KCl 20 mEq / L 100 mL (06/08/13 1435)    Allergies  Allergen Reactions  . Bee Venom     anaphalaxis    History   Social History  . Marital Status: Divorced    Spouse Name: N/A    Number of Children: N/A  . Years of Education: N/A   Occupational History  . Not on file.   Social  History Main Topics  . Smoking status: Never Smoker   . Smokeless tobacco: Never Used  . Alcohol Use: No  . Drug Use: No  . Sexual Activity: No   Other Topics Concern  . Not on file   Social History Narrative  . No narrative on file    History reviewed. No pertinent family history.  ROS- All systems are reviewed and negative except as per the HPI above  Physical Exam: Telemetry: Filed Vitals:   06/08/13 0550 06/08/13 0703 06/08/13 1001 06/08/13 1333  BP:   97/66 93/52  Pulse:    81  Temp:   97.7 F (36.5 C) 97.8 F (36.6 C)  TempSrc:   Oral Oral  Resp:   20 20  Height:      Weight: 177 lb 12.8 oz (80.65 kg)     SpO2:  92% 97% 97%    GEN- The patient is overweight appearing, alert and oriented x 3 today.  Coughs frequently during the exam Head- normocephalic, atraumatic Eyes-  Sclera clear, conjunctiva pink Ears- hearing intact Oropharynx- clear Neck- supple  Lungs- diffuse rhonchii, few basilar rales, frequent productive cough with some  gagging during my encounter Heart- Regular rate and rhythm, 2/6 SEM at the apex GI- soft, NT, ND, + BS Extremities- no clubbing, cyanosis, or edema MS- s/p R BKA Skin- no rash or lesion Psych- euthymic mood, full affect Neuro- strength and sensation are intact  EKG 06/07/13- sinus tachycardia 107 bpm, PR 186 msec, Qtc 432 msec, nonspecific St/T changes  Labs:   Lab Results  Component Value Date   WBC 5.8 06/08/2013   HGB 9.1* 06/08/2013   HCT 29.5* 06/08/2013   MCV 86.8 06/08/2013   PLT 151 06/08/2013    Recent Labs Lab 06/08/13 0547  NA 146  K 3.3*  CL 111  CO2 21  BUN 10  CREATININE 0.80  CALCIUM 8.0*  GLUCOSE 121*   Radiology: cxr reviewed   Echo:  pending  ASSESSMENT AND PLAN:   1. Transient complete heart block I have reviewed his telemetry which reveals 2 episodes of transient AV block.  Though I have no clear history to guide from the 5:30 am event, he is very clear that his second event was in the setting of forceful cough/ gagging/ and vomiting.  This was almost certainly vagal in origin. During my encounter, he had very frequent and forceful coughing with some gagging and nausea.  I suspect that this is also the cause for his initial episode. At this point, I would recommend treatment of pneumonia/ cough by primary team.  Observe overnight in TCU on telemetry for recurrent episodes not related to a vagal episode.  I suspect that permanent pacing or further Ep workup can be avoided in this patient. Please call with questions.  Hillis Range MD   Hillis Range, MD 06/08/2013  5:11 PM

## 2013-06-08 NOTE — Progress Notes (Signed)
Pt transported to 2H28 per bed with pacing pads on chest chart and meds with pt.

## 2013-06-08 NOTE — Progress Notes (Signed)
Caled report to 2H pt will be transferred to 2H28 after transfer orders are in.

## 2013-06-08 NOTE — Progress Notes (Signed)
12 Lead EKG done per order showed NSR with Occasional PVC's, nonspecific T wave abnormality.

## 2013-06-08 NOTE — Progress Notes (Addendum)
[facility] PROGRESS NOTE  [name] AVW:[id number] DOB: Nov 12, 1950 DOA: 06/07/2013 PCP: No primary provider on file.  Assessment/Plan: 63 y.o. male with a past medical history of CHF and possibly AFib in 2005 but no valvular disease or CAD, h/o HIV, last CD4 count greater than 600 per patient several months ago, history of severe reflux leading to aspiration pneumonias in the past, status post fundoplication procedure performed in [place] this year, who is currently in the area visiting his parents. He presents to the emergency department today with complaints of cough, fever reporting a temperature of 103 at home this morning, green sputum production, and shortness of breath which has progressively worsened over the past 4 days -patient noted to have complete HB in the hospital    1. Sepsis/pneumonia evidenced by hypotension; Chest x-ray showing infiltrate involving left lower lung -cont IV atx, IVF prn  Bronchodilators; pend blood c/s;   2. Complete heart block; transient, symptomatic  - not taking any AV nodal blocking medications and would avoid  -per cardiology management; pend echo; monitor SDU  3. History of CHF and possibly AFib per patient  - in 2005 and reports he has not seen a cardiologist since that time  -monitor on tele; cardiology following   4. Hypokalemia; replace recheck in AM   5. HIV. He states having last CD4 count greater than 600 several months ago. Will continue anti-retroviral therapy with Truvada and Raltegravir.   6. Severe gastroesophageal reflux disease. Continue Protonix 40 mg by mouth twice a day and Carafate. Status post fundoplication.  7. Anemia; no s/s of acute bleeding; close monitor hg; check iron profile   Code Status: full Family Communication: son at the bedside (indicate person spoken with, relationship, and if by phone, the number) Disposition Plan: home when improved    Consultants:  Cardiology   Procedures:  Echo pend    Antibiotics:  Zosyn 1/1<<<<  azythro 1/1<<< (indicate start date, and stop date if known)  HPI/Subjective: alert  Objective: Filed Vitals:   06/08/13 1001  BP: 97/66  Pulse:   Temp: 97.7 F (36.5 C)  Resp: 20    Intake/Output Summary (Last 24 hours) at 06/08/13 1152 Last data filed at 06/08/13 8295  Gross per 24 hour  Intake 1608.33 ml  Output   1395 ml  Net 213.33 ml   Filed Weights   06/07/13 1226 06/07/13 1711 06/08/13 0550  Weight: 81.647 kg (180 lb) 81.2 kg (179 lb 0.2 oz) 80.65 kg (177 lb 12.8 oz)    Exam:   General:  alert  Cardiovascular: s1,s2 rrr  Respiratory: LL crackles   Abdomen: soft, nt, nd   Musculoskeletal: BKA L   Data Reviewed: Basic Metabolic Panel:  Recent Labs Lab 06/07/13 1232 06/07/13 1250 06/07/13 1720 06/08/13 0547  NA 138 136*  --  146  K 4.5 4.2  --  3.3*  CL 101 100  --  111  CO2 24  --   --  21  GLUCOSE 112* 119*  --  121*  BUN 15 15  --  10  CREATININE 1.05 1.10 0.85 0.80  CALCIUM 8.7  --   --  8.0*   Liver Function Tests: No results found for this basename: AST, ALT, ALKPHOS, BILITOT, PROT, ALBUMIN,  in the last 168 hours No results found for this basename: LIPASE, AMYLASE,  in the last 168 hours No results found for this basename: AMMONIA,  in the last 168 hours CBC:  Recent Labs Lab 06/07/13 1232  06/07/13 1250 06/07/13 1720 06/08/13 0547  WBC 10.7*  --  10.5 5.8  HGB 10.6* 11.6* 9.1* 9.1*  HCT 33.3* 34.0* 28.6* 29.5*  MCV 85.6  --  85.9 86.8  PLT 173  --  162 151   Cardiac Enzymes:  Recent Labs Lab 06/07/13 1239  TROPONINI <0.30   BNP (last 3 results)  Recent Labs  06/07/13 1720  PROBNP 121.5   CBG: No results found for this basename: GLUCAP,  in the last 168 hours  No results found for this or any previous visit (from the past 240 hour(s)).   Studies: Dg Chest 2 View  06/07/2013   CLINICAL DATA:  Fever and cough.  EXAM: CHEST  2 VIEW  COMPARISON:  None.  FINDINGS: Two views of  the chest demonstrate mild elevation of the right hemidiaphragm. There are streaky densities at the left lung base which could an infectious process. Few densities near the right costophrenic angle may represent atelectasis or scarring. Surgical clips in the right infrahilar region. There are streaky densities in the right superior hilar region that may represent overlying bone structures but nonspecific. No definite pleural effusions.  IMPRESSION: Streaky densities at the left lung base are concerning for an acute infectious or inflammatory process but difficult to exclude chronic changes at this location.  Postoperative changes in the right hemithorax with mild elevation of the right hemidiaphragm. Streaky densities at the right costophrenic angle and right suprahilar region are nonspecific.  Recommend short-term followup to evaluate for resolution of the densities at the left lung base. The patient may need a chest CT to follow the streaky densities in the right lung.   Electronically Signed   By: Richarda OverlieAdam  Henn M.D.   On: 06/07/2013 13:37    Scheduled Meds: . sodium chloride   Intravenous STAT  . aspirin  325 mg Oral Daily  . azithromycin  500 mg Intravenous Q24H  . buPROPion  100 mg Oral Daily  . emtricitabine-tenofovir  1 tablet Oral Daily  . enoxaparin (LOVENOX) injection  40 mg Subcutaneous Q24H  . fludrocortisone  0.1 mg Oral Daily  . FLUoxetine  40 mg Oral BID  . levothyroxine  50 mcg Oral QAC breakfast  . metoCLOPramide  5 mg Oral BID  . oxybutynin  10 mg Oral BID  . pantoprazole  40 mg Oral BID  . piperacillin-tazobactam (ZOSYN)  IV  3.375 g Intravenous Q8H  . pregabalin  75 mg Oral BID  . QUEtiapine  100 mg Oral QHS  . raltegravir  400 mg Oral BID  . sucralfate  1 g Oral TID WC & HS  . tamsulosin  0.4 mg Oral BID  . traZODone  100 mg Oral QHS   Continuous Infusions: . sodium chloride 100 mL/hr at 06/07/13 2230    Active Problems:   CAP (community acquired pneumonia)   Sepsis    Aspiration pneumonia   HIV (human immunodeficiency virus infection)   Pneumonia   Complete heart block    Time spent: >35 minutes     Dillon SheetsBURIEV, [name]  [facility] Pager [phone]. If 7PM-7AM, please contact night-coverage at [email or web address], password [email or web address] 06/08/2013, 11:52 AM  LOS: 1 day

## 2013-06-08 NOTE — Progress Notes (Signed)
RN notified by CMT of pt. Rhythm change. RN in to assess pt. VS obtained and stable. Pt. Resting in bed. No distress noted. Pt. Stated that he felt some dizziness. On call MD, Maretta BeesJ. McClung, notified via text page. NT unable to obtain standing wt. D/t pt. Being dizzy. Bed weight obtained. RN will continue to monitor pt. For changes in condition. Rona Tomson, Cheryll DessertKaren Cherrell

## 2013-06-08 NOTE — Care Management Note (Signed)
    Page 1 of 1   06/08/2013     2:37:27 PM   CARE MANAGEMENT NOTE 06/08/2013  Patient:  [name]   Account Number:  [id number]  Date Initiated:  06/08/2013  Documentation initiated by:  Junius CreamerWELL,DEBBIE  Subjective/Objective Assessment:   adm w pneumonia     Action/Plan:   [place] in town vis parents   Anticipated DC Date:     Anticipated DC Plan:           Choice offered to / List presented to:             Status of service:   Medicare Important Message given?   (If response is "NO", the following Medicare IM given date fields will be blank) Date Medicare IM given:   Date Additional Medicare IM given:    Discharge Disposition:    Per UR Regulation:  Reviewed for med. necessity/level of care/duration of stay  If discussed at Long Length of Stay Meetings, dates discussed:    Comments:

## 2013-06-09 LAB — CBC
HCT: 28.7 % — ABNORMAL LOW (ref 39.0–52.0)
Hemoglobin: 9 g/dL — ABNORMAL LOW (ref 13.0–17.0)
MCH: 27.1 pg (ref 26.0–34.0)
MCHC: 31.4 g/dL (ref 30.0–36.0)
MCV: 86.4 fL (ref 78.0–100.0)
Platelets: 179 10*3/uL (ref 150–400)
RBC: 3.32 MIL/uL — ABNORMAL LOW (ref 4.22–5.81)
RDW: 14.7 % (ref 11.5–15.5)
WBC: 6 10*3/uL (ref 4.0–10.5)

## 2013-06-09 LAB — BASIC METABOLIC PANEL
BUN: 8 mg/dL (ref 6–23)
CALCIUM: 7.9 mg/dL — AB (ref 8.4–10.5)
CO2: 22 mEq/L (ref 19–32)
Chloride: 111 mEq/L (ref 96–112)
Creatinine, Ser: 0.81 mg/dL (ref 0.50–1.35)
Glucose, Bld: 96 mg/dL (ref 70–99)
POTASSIUM: 3.6 meq/L — AB (ref 3.7–5.3)
SODIUM: 146 meq/L (ref 137–147)

## 2013-06-09 MED ORDER — GUAIFENESIN-CODEINE 100-10 MG/5ML PO SOLN
5.0000 mL | Freq: Four times a day (QID) | ORAL | Status: DC | PRN
  Administered 2013-06-09 – 2013-06-13 (×8): 5 mL via ORAL
  Filled 2013-06-09 (×8): qty 5

## 2013-06-09 NOTE — Progress Notes (Addendum)
Patient ID: [name], male   DOB: 10/29/1950, 63 y.o.   MRN: [id number]   SUBJECTIVE: Patient had another episode of coughing/vomiting around 6:30 am with transient CHB again.  He did not get lightheaded this time.    Marland Kitchen aspirin  325 mg Oral Daily  . azithromycin  500 mg Intravenous Q24H  . buPROPion  100 mg Oral Daily  . emtricitabine-tenofovir  1 tablet Oral Daily  . enoxaparin (LOVENOX) injection  40 mg Subcutaneous Q24H  . fludrocortisone  0.1 mg Oral Daily  . FLUoxetine  40 mg Oral BID  . levothyroxine  50 mcg Oral QAC breakfast  . metoCLOPramide  5 mg Oral BID  . oxybutynin  10 mg Oral BID  . pantoprazole  40 mg Oral BID  . piperacillin-tazobactam (ZOSYN)  IV  3.375 g Intravenous Q8H  . pregabalin  75 mg Oral BID  . QUEtiapine  100 mg Oral QHS  . raltegravir  400 mg Oral BID  . sucralfate  1 g Oral TID WC & HS  . tamsulosin  0.4 mg Oral BID  . traZODone  100 mg Oral QHS      Filed Vitals:   06/09/13 0400 06/09/13 0500 06/09/13 0712 06/09/13 0800  BP: 117/72 111/60 107/68 112/75  Pulse:   88 79  Temp: 97.5 F (36.4 C)  99.2 F (37.3 C) 99.3 F (37.4 C)  TempSrc: Oral  Oral Oral  Resp: 22 23 18 23   Height:      Weight:      SpO2:   92% 93%    Intake/Output Summary (Last 24 hours) at 06/09/13 1002 Last data filed at 06/09/13 0900  Gross per 24 hour  Intake 2436.67 ml  Output    975 ml  Net 1461.67 ml    LABS: Basic Metabolic Panel:  Recent Labs  40/98/11 0547 06/09/13 0555  NA 146 146  K 3.3* 3.6*  CL 111 111  CO2 21 22  GLUCOSE 121* 96  BUN 10 8  CREATININE 0.80 0.81  CALCIUM 8.0* 7.9*   Liver Function Tests: No results found for this basename: AST, ALT, ALKPHOS, BILITOT, PROT, ALBUMIN,  in the last 72 hours No results found for this basename: LIPASE, AMYLASE,  in the last 72 hours CBC:  Recent Labs  06/08/13 0547 06/09/13 0555  WBC 5.8 6.0  HGB 9.1* 9.0*  HCT 29.5* 28.7*  MCV 86.8 86.4  PLT 151 179   Cardiac Enzymes:  Recent  Labs  06/07/13 1239  TROPONINI <0.30   BNP: No components found with this basename: POCBNP,  D-Dimer: No results found for this basename: DDIMER,  in the last 72 hours Hemoglobin A1C: No results found for this basename: HGBA1C,  in the last 72 hours Fasting Lipid Panel: No results found for this basename: CHOL, HDL, LDLCALC, TRIG, CHOLHDL, LDLDIRECT,  in the last 72 hours Thyroid Function Tests:  Recent Labs  06/07/13 1720  TSH 3.584   Anemia Panel: No results found for this basename: VITAMINB12, FOLATE, FERRITIN, TIBC, IRON, RETICCTPCT,  in the last 72 hours  RADIOLOGY: Dg Chest 2 View  06/07/2013   CLINICAL DATA:  Fever and cough.  EXAM: CHEST  2 VIEW  COMPARISON:  None.  FINDINGS: Two views of the chest demonstrate mild elevation of the right hemidiaphragm. There are streaky densities at the left lung base which could an infectious process. Few densities near the right costophrenic angle may represent atelectasis or scarring. Surgical clips in the right infrahilar region.  There are streaky densities in the right superior hilar region that may represent overlying bone structures but nonspecific. No definite pleural effusions.  IMPRESSION: Streaky densities at the left lung base are concerning for an acute infectious or inflammatory process but difficult to exclude chronic changes at this location.  Postoperative changes in the right hemithorax with mild elevation of the right hemidiaphragm. Streaky densities at the right costophrenic angle and right suprahilar region are nonspecific.  Recommend short-term followup to evaluate for resolution of the densities at the left lung base. The patient may need a chest CT to follow the streaky densities in the right lung.   Electronically Signed   By: Dillon OverlieAdam  Webb M.D.   On: 06/07/2013 13:37    PHYSICAL EXAM General: NAD Neck: No JVD, no thyromegaly or thyroid nodule.  Lungs: Crackles at bases bilaterally.  CV: Nondisplaced PMI.  Heart regular  S1/S2, no S3/S4, no murmur.  No peripheral edema.  No carotid bruit.  Normal pedal pulses.  Abdomen: Soft, nontender, no hepatosplenomegaly, no distention.  Neurologic: Alert and oriented x 3.  Psych: Normal affect. Extremities: No clubbing or cyanosis.   TELEMETRY: Reviewed telemetry pt in NSR, had transient CHB with 9.8 second lack of ventricular conduction at 6:30 am today.   ASSESSMENT AND PLAN: As per Dr. Jenel Webb's note yesterday, the patient's episodes of transient CHB (now 3) appear to be vagal related - coughing/vomiting prior to events.  He was not lightheaded with the event this morning.  For now, needs treatment of PNA and cough.  I agree that he is unlikely to need PCM unless he has CHB episodes not associated with cough/emesis.  EF low normal on echo.   [name] 06/09/2013 10:05 AM

## 2013-06-09 NOTE — Progress Notes (Signed)
[facility] PROGRESS NOTE  [name] ZOX:[id number] DOB: November 15, 1950 DOA: 06/07/2013 PCP: No primary provider on file.  Assessment/Plan: 63 y.o. male with a past medical history of CHF and possibly AFib in 2005 but no valvular disease or CAD, h/o HIV, last CD4 count greater than 600 per patient several months ago, history of severe reflux leading to aspiration pneumonias in the past, status post fundoplication procedure performed in [place] this year, who is currently in the area visiting his parents. He presents to the emergency department today with complaints of cough, fever reporting a temperature of 103 at home this morning, green sputum production, and shortness of breath which has progressively worsened over the past 4 days -patient noted to have complete HB in the hospital    1. Sepsis/pneumonia evidenced by hypotension; Chest x-ray showing infiltrate involving left lower lung -cont IV atx, IVF prn  Bronchodilators; pend blood c/s;   2. Complete heart block; transient, symptomatic in the setting of coughing likely vagal response;  - not taking any AV nodal blocking medications and would avoid  - per cardiology management; no indication for PPM at this time;  monitor SDU; try robitussin AC for cough   3. History of CHF and possibly AFib per patient  - cardiology following; echo : LVEF 55%; no s/s of fluid overload; holding PO diuretics while hypotensive on IVF; resume as needed diuretics when hemodynamically stable   4. HIV. He states having last CD4 count greater than 600 several months ago. Will continue anti-retroviral therapy with Truvada and Raltegravir.   5. Severe gastroesophageal reflux disease. Continue Protonix 40 mg by mouth twice a day and Carafate. Status post fundoplication.  7. Anemia; no s/s of acute bleeding; close monitor hg; check iron profile   Code Status: full Family Communication: son at the bedside (indicate person spoken with, relationship, and  if by phone, the number) Disposition Plan: home when improved    Consultants:  Cardiology   Procedures:  Echo pend   Antibiotics:  Zosyn 1/1<<<<  azythro 1/1<<< (indicate start date, and stop date if known)  HPI/Subjective: alert  Objective: Filed Vitals:   06/09/13 0800  BP: 112/75  Pulse: 79  Temp: 99.3 F (37.4 C)  Resp: 23    Intake/Output Summary (Last 24 hours) at 06/09/13 1132 Last data filed at 06/09/13 1100  Gross per 24 hour  Intake 2636.67 ml  Output    975 ml  Net 1661.67 ml   Filed Weights   06/07/13 1226 06/07/13 1711 06/08/13 0550  Weight: 81.647 kg (180 lb) 81.2 kg (179 lb 0.2 oz) 80.65 kg (177 lb 12.8 oz)    Exam:   General:  alert  Cardiovascular: s1,s2 rrr  Respiratory: LL crackles   Abdomen: soft, nt, nd   Musculoskeletal: BKA L   Data Reviewed: Basic Metabolic Panel:  Recent Labs Lab 06/07/13 1232 06/07/13 1250 06/07/13 1720 06/08/13 0547 06/09/13 0555  NA 138 136*  --  146 146  K 4.5 4.2  --  3.3* 3.6*  CL 101 100  --  111 111  CO2 24  --   --  21 22  GLUCOSE 112* 119*  --  121* 96  BUN 15 15  --  10 8  CREATININE 1.05 1.10 0.85 0.80 0.81  CALCIUM 8.7  --   --  8.0* 7.9*   Liver Function Tests: No results found for this basename: AST, ALT, ALKPHOS, BILITOT, PROT, ALBUMIN,  in the last 168 hours No results found for  this basename: LIPASE, AMYLASE,  in the last 168 hours No results found for this basename: AMMONIA,  in the last 168 hours CBC:  Recent Labs Lab 06/07/13 1232 06/07/13 1250 06/07/13 1720 06/08/13 0547 06/09/13 0555  WBC 10.7*  --  10.5 5.8 6.0  HGB 10.6* 11.6* 9.1* 9.1* 9.0*  HCT 33.3* 34.0* 28.6* 29.5* 28.7*  MCV 85.6  --  85.9 86.8 86.4  PLT 173  --  162 151 179   Cardiac Enzymes:  Recent Labs Lab 06/07/13 1239  TROPONINI <0.30   BNP (last 3 results)  Recent Labs  06/07/13 1720  PROBNP 121.5   CBG: No results found for this basename: GLUCAP,  in the last 168 hours  Recent  Results (from the past 240 hour(s))  MRSA PCR SCREENING     Status: None   Collection Time    06/08/13  2:23 PM      Result Value Range Status   MRSA by PCR NEGATIVE  NEGATIVE Final   Comment:            The GeneXpert MRSA Assay (FDA     approved for NASAL specimens     only), is one component of a     comprehensive MRSA colonization     surveillance program. It is not     intended to diagnose MRSA     infection nor to guide or     monitor treatment for     MRSA infections.     Studies: Dg Chest 2 View  06/07/2013   CLINICAL DATA:  Fever and cough.  EXAM: CHEST  2 VIEW  COMPARISON:  None.  FINDINGS: Two views of the chest demonstrate mild elevation of the right hemidiaphragm. There are streaky densities at the left lung base which could an infectious process. Few densities near the right costophrenic angle may represent atelectasis or scarring. Surgical clips in the right infrahilar region. There are streaky densities in the right superior hilar region that may represent overlying bone structures but nonspecific. No definite pleural effusions.  IMPRESSION: Streaky densities at the left lung base are concerning for an acute infectious or inflammatory process but difficult to exclude chronic changes at this location.  Postoperative changes in the right hemithorax with mild elevation of the right hemidiaphragm. Streaky densities at the right costophrenic angle and right suprahilar region are nonspecific.  Recommend short-term followup to evaluate for resolution of the densities at the left lung base. The patient may need a chest CT to follow the streaky densities in the right lung.   Electronically Signed   By: Richarda Overlie M.D.   On: 06/07/2013 13:37    Scheduled Meds: . aspirin  325 mg Oral Daily  . azithromycin  500 mg Intravenous Q24H  . buPROPion  100 mg Oral Daily  . emtricitabine-tenofovir  1 tablet Oral Daily  . enoxaparin (LOVENOX) injection  40 mg Subcutaneous Q24H  . fludrocortisone   0.1 mg Oral Daily  . FLUoxetine  40 mg Oral BID  . levothyroxine  50 mcg Oral QAC breakfast  . metoCLOPramide  5 mg Oral BID  . oxybutynin  10 mg Oral BID  . pantoprazole  40 mg Oral BID  . piperacillin-tazobactam (ZOSYN)  IV  3.375 g Intravenous Q8H  . pregabalin  75 mg Oral BID  . QUEtiapine  100 mg Oral QHS  . raltegravir  400 mg Oral BID  . sucralfate  1 g Oral TID WC & HS  . tamsulosin  0.4 mg Oral BID  . traZODone  100 mg Oral QHS   Continuous Infusions: . 0.9 % NaCl with KCl 20 mEq / L 100 mL/hr at 06/09/13 1102    Active Problems:   CAP (community acquired pneumonia)   Sepsis   Aspiration pneumonia   HIV (human immunodeficiency virus infection)   Pneumonia   Complete heart block    Time spent: >35 minutes     Esperanza SheetsBURIEV, Deleon Passe N  [facility] Pager [phone]. If 7PM-7AM, please contact night-coverage at [email or web address], password [email or web address] 06/09/2013, 11:32 AM  LOS: 2 days

## 2013-06-10 LAB — BASIC METABOLIC PANEL
BUN: 8 mg/dL (ref 6–23)
CO2: 24 mEq/L (ref 19–32)
Calcium: 7.9 mg/dL — ABNORMAL LOW (ref 8.4–10.5)
Chloride: 111 mEq/L (ref 96–112)
Creatinine, Ser: 0.75 mg/dL (ref 0.50–1.35)
GFR calc Af Amer: >90 mL/min (ref >90)
GLUCOSE: 102 mg/dL — AB (ref 70–99)
POTASSIUM: 3.5 meq/L — AB (ref 3.7–5.3)
Sodium: 144 mEq/L (ref 137–147)

## 2013-06-10 LAB — CBC
HCT: 28 % — ABNORMAL LOW (ref 39.0–52.0)
HEMOGLOBIN: 8.9 g/dL — AB (ref 13.0–17.0)
MCH: 27.1 pg (ref 26.0–34.0)
MCHC: 31.8 g/dL (ref 30.0–36.0)
MCV: 85.1 fL (ref 78.0–100.0)
PLATELETS: 174 10*3/uL (ref 150–400)
RBC: 3.29 MIL/uL — AB (ref 4.22–5.81)
RDW: 14.7 % (ref 11.5–15.5)
WBC: 5.9 10*3/uL (ref 4.0–10.5)

## 2013-06-10 MED ORDER — POLYETHYLENE GLYCOL 3350 17 G PO PACK
17.0000 g | PACK | Freq: Every day | ORAL | Status: DC | PRN
  Administered 2013-06-11: 17 g via ORAL
  Filled 2013-06-10: qty 1

## 2013-06-10 MED ORDER — AZITHROMYCIN 500 MG PO TABS
500.0000 mg | ORAL_TABLET | Freq: Every day | ORAL | Status: DC
  Administered 2013-06-10 – 2013-06-13 (×4): 500 mg via ORAL
  Filled 2013-06-10 (×4): qty 1

## 2013-06-10 MED ORDER — SENNOSIDES-DOCUSATE SODIUM 8.6-50 MG PO TABS
1.0000 | ORAL_TABLET | Freq: Two times a day (BID) | ORAL | Status: DC
  Administered 2013-06-10 – 2013-06-11 (×3): 1 via ORAL
  Filled 2013-06-10 (×6): qty 1

## 2013-06-10 NOTE — Progress Notes (Signed)
[facility] PROGRESS NOTE  [name] WUJ:[id number] DOB: 12-04-1950 DOA: 06/07/2013 PCP: No primary provider on file.  Assessment/Plan: 63 y.o. male with a past medical history of CHF and possibly AFib in 2005 but no valvular disease or CAD, h/o HIV, last CD4 count greater than 600 per patient several months ago, history of severe reflux leading to aspiration pneumonias in the past, status post fundoplication procedure performed in [place] this year, who is currently in the area visiting his parents. He presents to the emergency department today with complaints of cough, fever reporting a temperature of 103 at home this morning, green sputum production, and shortness of breath which has progressively worsened over the past 4 days -patient noted to have complete HB in the hospital    1. Sepsis/pneumonia evidenced by hypotension; Chest x-ray showing infiltrate involving left lower lung -improving on IV atx, IVF prn  Bronchodilators; blood c/s NTD  2. Complete heart block; transient, symptomatic in the setting of coughing likely vagal response;  -no new episodes; per cardiology no indication for PPM at this time;  monitor SDU; try robitussin AC for cough   3. History of CHF and possibly AFib per patient  - cardiology following; echo: LVEF 55%; no s/s of fluid overload; holding PO diuretics while hypotensive on IVF; resume as needed diuretics when hemodynamically stable   4. HIV. He states having last CD4 count greater than 600 several months ago. Will continue anti-retroviral therapy with Truvada and Raltegravir.   5. Severe gastroesophageal reflux disease. Continue Protonix 40 mg by mouth twice a day and Carafate. Status post fundoplication.  7. Anemia; no s/s of acute bleeding; close monitor hg; check iron profile   TF to RNF on 1/4  Code Status: full Family Communication: son at the bedside (indicate person spoken with, relationship, and if by phone, the number) Disposition  Plan: home when improved    Consultants:  Cardiology   Procedures:  Echo pend   Antibiotics:  Zosyn 1/1<<<<  azythro 1/1<<< (indicate start date, and stop date if known)  HPI/Subjective: alert  Objective: Filed Vitals:   06/10/13 0800  BP: 110/81  Pulse: 68  Temp: 98.4 F (36.9 C)  Resp: 18    Intake/Output Summary (Last 24 hours) at 06/10/13 0944 Last data filed at 06/10/13 0600  Gross per 24 hour  Intake 3022.5 ml  Output    350 ml  Net 2672.5 ml   Filed Weights   06/07/13 1226 06/07/13 1711 06/08/13 0550  Weight: 81.647 kg (180 lb) 81.2 kg (179 lb 0.2 oz) 80.65 kg (177 lb 12.8 oz)    Exam:   General:  alert  Cardiovascular: s1,s2 rrr  Respiratory: LL crackles   Abdomen: soft, nt, nd   Musculoskeletal: BKA L   Data Reviewed: Basic Metabolic Panel:  Recent Labs Lab 06/07/13 1232 06/07/13 1250 06/07/13 1720 06/08/13 0547 06/09/13 0555 06/10/13 0600  NA 138 136*  --  146 146 144  K 4.5 4.2  --  3.3* 3.6* 3.5*  CL 101 100  --  111 111 111  CO2 24  --   --  21 22 24   GLUCOSE 112* 119*  --  121* 96 102*  BUN 15 15  --  10 8 8   CREATININE 1.05 1.10 0.85 0.80 0.81 0.75  CALCIUM 8.7  --   --  8.0* 7.9* 7.9*   Liver Function Tests: No results found for this basename: AST, ALT, ALKPHOS, BILITOT, PROT, ALBUMIN,  in the last 168 hours  No results found for this basename: LIPASE, AMYLASE,  in the last 168 hours No results found for this basename: AMMONIA,  in the last 168 hours CBC:  Recent Labs Lab 06/07/13 1232 06/07/13 1250 06/07/13 1720 06/08/13 0547 06/09/13 0555 06/10/13 0600  WBC 10.7*  --  10.5 5.8 6.0 5.9  HGB 10.6* 11.6* 9.1* 9.1* 9.0* 8.9*  HCT 33.3* 34.0* 28.6* 29.5* 28.7* 28.0*  MCV 85.6  --  85.9 86.8 86.4 85.1  PLT 173  --  162 151 179 174   Cardiac Enzymes:  Recent Labs Lab 06/07/13 1239  TROPONINI <0.30   BNP (last 3 results)  Recent Labs  06/07/13 1720  PROBNP 121.5   CBG: No results found for this  basename: GLUCAP,  in the last 168 hours  Recent Results (from the past 240 hour(s))  CULTURE, BLOOD (ROUTINE X 2)     Status: None   Collection Time    06/07/13  3:35 PM      Result Value Range Status   Specimen Description BLOOD LEFT ANTECUBITAL   Final   Special Requests BOTTLES DRAWN AEROBIC AND ANAEROBIC 10CC   Final   Culture  Setup Time     Final   Value: 06/08/2013 01:31     Performed at [facility]   Culture     Final   Value:        BLOOD CULTURE RECEIVED NO GROWTH TO DATE CULTURE WILL BE HELD FOR 5 DAYS BEFORE ISSUING A FINAL NEGATIVE REPORT     Performed at [facility]   Report Status PENDING   Incomplete  CULTURE, BLOOD (ROUTINE X 2)     Status: None   Collection Time    06/07/13  3:50 PM      Result Value Range Status   Specimen Description BLOOD ARM RIGHT   Final   Special Requests BOTTLES DRAWN AEROBIC AND ANAEROBIC 10CC   Final   Culture  Setup Time     Final   Value: 06/08/2013 01:29     Performed at [facility]   Culture     Final   Value:        BLOOD CULTURE RECEIVED NO GROWTH TO DATE CULTURE WILL BE HELD FOR 5 DAYS BEFORE ISSUING A FINAL NEGATIVE REPORT     Performed at [facility]   Report Status PENDING   Incomplete  MRSA PCR SCREENING     Status: None   Collection Time    06/08/13  2:23 PM      Result Value Range Status   MRSA by PCR NEGATIVE  NEGATIVE Final   Comment:            The GeneXpert MRSA Assay (FDA     approved for NASAL specimens     only), is one component of a     comprehensive MRSA colonization     surveillance program. It is not     intended to diagnose MRSA     infection nor to guide or     monitor treatment for     MRSA infections.     Studies: No results found.  Scheduled Meds: . aspirin  325 mg Oral Daily  . azithromycin  500 mg Intravenous Q24H  . buPROPion  100 mg Oral Daily  . emtricitabine-tenofovir  1 tablet Oral Daily  . enoxaparin (LOVENOX) injection  40 mg Subcutaneous Q24H   . fludrocortisone  0.1 mg Oral Daily  . FLUoxetine  40 mg Oral BID  . levothyroxine  50 mcg Oral QAC breakfast  . metoCLOPramide  5 mg Oral BID  . oxybutynin  10 mg Oral BID  . pantoprazole  40 mg Oral BID  . piperacillin-tazobactam (ZOSYN)  IV  3.375 g Intravenous Q8H  . pregabalin  75 mg Oral BID  . QUEtiapine  100 mg Oral QHS  . raltegravir  400 mg Oral BID  . sucralfate  1 g Oral TID WC & HS  . tamsulosin  0.4 mg Oral BID  . traZODone  100 mg Oral QHS   Continuous Infusions: . 0.9 % NaCl with KCl 20 mEq / L 100 mL (06/09/13 2215)    Active Problems:   CAP (community acquired pneumonia)   Sepsis   Aspiration pneumonia   HIV (human immunodeficiency virus infection)   Pneumonia   Complete heart block    Time spent: >35 minutes     Esperanza SheetsBURIEV, Shaniece Bussa N  [facility] Pager [phone]. If 7PM-7AM, please contact night-coverage at [email or web address], password [email or web address] 06/10/2013, 9:44 AM  LOS: 3 days

## 2013-06-10 NOTE — Progress Notes (Signed)
Patient ID: [name], male   DOB: 07-Nov-1950, 63 y.o.   MRN: [id number]    SUBJECTIVE: No further heart block episodes.    Marland Kitchen aspirin  325 mg Oral Daily  . azithromycin  500 mg Intravenous Q24H  . buPROPion  100 mg Oral Daily  . emtricitabine-tenofovir  1 tablet Oral Daily  . enoxaparin (LOVENOX) injection  40 mg Subcutaneous Q24H  . fludrocortisone  0.1 mg Oral Daily  . FLUoxetine  40 mg Oral BID  . levothyroxine  50 mcg Oral QAC breakfast  . metoCLOPramide  5 mg Oral BID  . oxybutynin  10 mg Oral BID  . pantoprazole  40 mg Oral BID  . piperacillin-tazobactam (ZOSYN)  IV  3.375 g Intravenous Q8H  . pregabalin  75 mg Oral BID  . QUEtiapine  100 mg Oral QHS  . raltegravir  400 mg Oral BID  . sucralfate  1 g Oral TID WC & HS  . tamsulosin  0.4 mg Oral BID  . traZODone  100 mg Oral QHS      Filed Vitals:   06/10/13 0200 06/10/13 0401 06/10/13 0531 06/10/13 0800  BP: 92/53 92/68 114/72 110/81  Pulse: 74 62  68  Temp:  97.2 F (36.2 C)  98.4 F (36.9 C)  TempSrc:  Oral  Oral  Resp: 25 21 7 18   Height:      Weight:      SpO2: 94% 94% 95% 95%    Intake/Output Summary (Last 24 hours) at 06/10/13 0942 Last data filed at 06/10/13 0600  Gross per 24 hour  Intake 3022.5 ml  Output    350 ml  Net 2672.5 ml    LABS: Basic Metabolic Panel:  Recent Labs  40/98/11 0555 06/10/13 0600  NA 146 144  K 3.6* 3.5*  CL 111 111  CO2 22 24  GLUCOSE 96 102*  BUN 8 8  CREATININE 0.81 0.75  CALCIUM 7.9* 7.9*   Liver Function Tests: No results found for this basename: AST, ALT, ALKPHOS, BILITOT, PROT, ALBUMIN,  in the last 72 hours No results found for this basename: LIPASE, AMYLASE,  in the last 72 hours CBC:  Recent Labs  06/09/13 0555 06/10/13 0600  WBC 6.0 5.9  HGB 9.0* 8.9*  HCT 28.7* 28.0*  MCV 86.4 85.1  PLT 179 174   Cardiac Enzymes:  Recent Labs  06/07/13 1239  TROPONINI <0.30   BNP: No components found with this basename: POCBNP,  D-Dimer: No  results found for this basename: DDIMER,  in the last 72 hours Hemoglobin A1C: No results found for this basename: HGBA1C,  in the last 72 hours Fasting Lipid Panel: No results found for this basename: CHOL, HDL, LDLCALC, TRIG, CHOLHDL, LDLDIRECT,  in the last 72 hours Thyroid Function Tests:  Recent Labs  06/07/13 1720  TSH 3.584   Anemia Panel: No results found for this basename: VITAMINB12, FOLATE, FERRITIN, TIBC, IRON, RETICCTPCT,  in the last 72 hours  RADIOLOGY: Dg Chest 2 View  06/07/2013   CLINICAL DATA:  Fever and cough.  EXAM: CHEST  2 VIEW  COMPARISON:  None.  FINDINGS: Two views of the chest demonstrate mild elevation of the right hemidiaphragm. There are streaky densities at the left lung base which could an infectious process. Few densities near the right costophrenic angle may represent atelectasis or scarring. Surgical clips in the right infrahilar region. There are streaky densities in the right superior hilar region that may represent overlying bone structures but nonspecific.  No definite pleural effusions.  IMPRESSION: Streaky densities at the left lung base are concerning for an acute infectious or inflammatory process but difficult to exclude chronic changes at this location.  Postoperative changes in the right hemithorax with mild elevation of the right hemidiaphragm. Streaky densities at the right costophrenic angle and right suprahilar region are nonspecific.  Recommend short-term followup to evaluate for resolution of the densities at the left lung base. The patient may need a chest CT to follow the streaky densities in the right lung.   Electronically Signed   By: Richarda OverlieAdam  Henn M.D.   On: 06/07/2013 13:37    PHYSICAL EXAM General: NAD Neck: No JVD, no thyromegaly or thyroid nodule.  Lungs: Crackles at bases bilaterally.  CV: Nondisplaced PMI.  Heart regular S1/S2, no S3/S4, no murmur.  No peripheral edema.  No carotid bruit.  Normal pedal pulses.  Abdomen: Soft,  nontender, no hepatosplenomegaly, no distention.  Neurologic: Alert and oriented x 3.  Psych: Normal affect. Extremities: No clubbing or cyanosis.   TELEMETRY: NSR  ASSESSMENT AND PLAN: No further CHB events over the last 24 hrs.  The patient's episodes of transient CHB (now 3) appear to be vagal related - coughing/vomiting prior to events.  For now, needs treatment of PNA and cough.  I agree that he is unlikely to need PCM unless he has CHB episodes not associated with cough/emesis. EF low normal on echo. We will follow telemetry.   Marca AnconaDalton Avonne Berkery 06/10/2013 9:42 AM

## 2013-06-11 DIAGNOSIS — J690 Pneumonitis due to inhalation of food and vomit: Secondary | ICD-10-CM

## 2013-06-11 LAB — T-HELPER CELLS (CD4) COUNT (NOT AT ARMC)
CD4 % Helper T Cell: 38 % (ref 33–55)
CD4 T CELL ABS: 290 /uL — AB (ref 400–2700)

## 2013-06-11 MED ORDER — BISACODYL 10 MG RE SUPP: 10.0000 mg | Freq: Once | RECTAL | Status: DC

## 2013-06-11 MED ORDER — IPRATROPIUM-ALBUTEROL 20-100 MCG/ACT IN AERS
1.0000 | INHALATION_SPRAY | Freq: Four times a day (QID) | RESPIRATORY_TRACT | Status: DC | PRN
  Filled 2013-06-11: qty 4

## 2013-06-11 NOTE — Progress Notes (Signed)
Per MD order - OK to leave IVs in 24hrs past expiration as long as sites look WNLs

## 2013-06-11 NOTE — Evaluation (Signed)
Physical Therapy Evaluation/ DIscharge Patient Details Name: [name] MRN: [id number] DOB: 09/08/1950 Today's Date: 06/11/2013 Time: 4098-1191 PT Time Calculation (min): 20 min  PT Assessment / Plan / Recommendation History of Present Illness  [name] is a 63 y.o. male with a past medical history of HIV, last CD4 count greater than 600 per patient several months ago, history of severe reflux leading to aspiration pneumonias in the past, status post fundoplication procedure performed in [place] this year, who is currently in the area visiting his parents. He presents to the emergency department today with complaints of cough, fever reporting a temperature of 103 at home this morning, green sputum production, and shortness of breath which has progressively worsened over the past 4 days. He also complains of associated chest pain with a deep inspiration and cough, generalized weakness, malaise, fatigue, and overall doing poorly at home. His physicians in [place] called in a prescription for levofloxacin which he took prior to this presentation. 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 Pt with history of bil wrist fxs and some limited ROM but functional for ADLs and donning prosthesis. Pt able to don prosthesis and perform mobility without assist. Pt does endorse his leg feels heavy from not ambulating for 4 days and encouraged to continue ambulation with nursing daily. No further therapy needs as pt at baseline, pt aware and agrees. Signing off.     PT Assessment  Patent does not need any further PT services    Follow Up Recommendations  No PT follow up    Does the patient have the potential to tolerate intense rehabilitation      Barriers to Discharge        Equipment  Recommendations  None recommended by PT    Recommendations for Other Services     Frequency      Precautions / Restrictions Precautions Precaution Comments: right prosthetic leg   Pertinent Vitals/Pain No pain HR 87      Mobility  Bed Mobility Bed Mobility: Supine to Sit Supine to Sit: 6: Modified independent (Device/Increase time);HOB flat Transfers Transfers: Sit to Stand;Stand to Sit Sit to Stand: 6: Modified independent (Device/Increase time);From bed Stand to Sit: 6: Modified independent (Device/Increase time);To chair/3-in-1;With armrests Ambulation/Gait Ambulation/Gait Assistance: 6: Modified independent (Device/Increase time) Ambulation Distance (Feet): 150 Feet Assistive device: Rolling walker Gait Pattern: Step-through pattern;Decreased stride length Gait velocity: decreased Stairs: No    Exercises     PT Diagnosis:    PT Problem List:   PT Treatment Interventions:       PT Goals(Current goals can be found in the care plan section) Acute Rehab PT Goals PT Goal Formulation: No goals set, d/c therapy  Visit Information  Last PT Received On: 06/11/13 Assistance Needed: +1 History of Present Illness: [name] is a 63 y.o. male with a past medical history of HIV, last CD4 count greater than 600 per patient several months ago, history of severe reflux leading to aspiration pneumonias in the past, status post fundoplication procedure performed in [place] this year, who is currently in the area visiting his parents. He presents to the emergency department today with complaints of cough, fever reporting a temperature of 103 at home this morning, green sputum production, and shortness of breath which has progressively worsened over the past 4 days. He  also complains of associated chest pain with a deep inspiration and cough, generalized weakness, malaise, fatigue, and overall doing poorly at home. His physicians in [place] called in a prescription for  levofloxacin which he took prior to this presentation. Chest x-ray performed in the emergency department today showed streaky densities in the left lung base concerning for an acute infectious or inflammatory process       Prior Functioning  Home Living Family/patient expects to be discharged to:: Private residence Living Arrangements: Alone Available Help at Discharge: Family;Available 24 hours/day Type of Home: House Home Access: Ramped entrance Home Layout: One level Home Equipment: Cane - single point;Walker - 2 wheels;Wheelchair - manual Prior Function Level of Independence: Independent Comments: pt typically doesn't use any AD but takes a cane if he knows he will be ambulating on unlevel ground Communication Communication: No difficulties    Cognition  Cognition Arousal/Alertness: Awake/alert Behavior During Therapy: WFL for tasks assessed/performed Overall Cognitive Status: Within Functional Limits for tasks assessed    Extremity/Trunk Assessment Upper Extremity Assessment Upper Extremity Assessment: Overall WFL for tasks assessed Lower Extremity Assessment Lower Extremity Assessment: Overall WFL for tasks assessed (Right BKA ) Cervical / Trunk Assessment Cervical / Trunk Assessment: Normal   Balance    End of Session PT - End of Session Equipment Utilized During Treatment: Gait belt;Other (comment) (right LE prosthesis) Activity Tolerance: Patient tolerated treatment well Patient left: in chair;with call bell/phone within reach;with nursing/sitter in room Nurse Communication: Mobility status  GP     Delorse Lekabor, Sausha Raymond Beth 06/11/2013, 2:20 PM Delaney MeigsMaija Tabor Jaylianna Tatlock, PT [phone]

## 2013-06-11 NOTE — Progress Notes (Addendum)
Pharmacy note: Re - inhaler consult  63 yo male here with SOB and noted with PNA. Patient in on Combivent PTA. I was asked by Dr. Elease HashimotoNahser today to look into his inhaler needs for COPD. He does not smoke and is currently on room air at 93-95%  -Patient reports he has had pulmonary function testing in the past but these results are not currently available -At home, he reports using his combivent inhaler most often as four times daily and sometimes uses it as needed -He reports he has been controlled well at home (this was prior to his current admission for PNA) -After questioning the patient about his use in hospital he would like to begin using this as PRN  Recommendations -Will restart his home combivent as PRN (discussed with Patient and Dr. York SpanielBuriev) -It appears he is getting adequate symptom control at home  -No other medication changes are suggested at this time  Thank you for asking pharmacy to be involved in the care of this patient.  Harland GermanAndrew Allura Doepke, Pharm D 06/11/2013 2:45 PM
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
## 2013-06-11 NOTE — Progress Notes (Signed)
[facility] PROGRESS NOTE  [name] ZOX:[id number] DOB: 01/05/1951 DOA: 06/07/2013 PCP: No primary provider on file.  Assessment/Plan: 63 y.o. male with a past medical history of CHF and possibly AFib in 2005 but no valvular disease or CAD, h/o HIV, last CD4 count greater than 600 per patient several months ago, history of severe reflux leading to aspiration pneumonias in the past, status post fundoplication procedure performed in [place] this year, who is currently in the area visiting his parents. He presents to the emergency department today with complaints of cough, fever reporting a temperature of 103 at home this morning, green sputum production, and shortness of breath which has progressively worsened over the past 4 days -patient noted to have complete HB in the hospital    1. Sepsis/pneumonia evidenced by hypotension; Chest x-ray showing infiltrate involving left lower lung -improving on IV atx, IVF prn  Bronchodilators; blood c/s NTD  2. Complete heart block; transient, symptomatic in the setting of coughing likely vagal response;  -no new episodes; per cardiology no indication for PPM at this time;  monitor SDU; try robitussin AC for cough   3. History of CHF and possibly AFib per patient  - cardiology following; echo: LVEF 55%; no s/s of fluid overload; holding PO diuretics while hypotensive on IVF; resume as needed diuretics when hemodynamically stable   4. HIV. He states having last CD4 count greater than 600 several months ago. Will continue anti-retroviral therapy with Truvada and Raltegravir.   5. Severe gastroesophageal reflux disease. Continue Protonix 40 mg by mouth twice a day and Carafate. Status post fundoplication.  7. Anemia; no s/s of acute bleeding; close monitor hg; check iron profile   TF to RNF on 1/4; PT eval  Code Status: full Family Communication: son at the bedside (indicate person spoken with, relationship, and if by phone, the  number) Disposition Plan: home when improved    Consultants:  Cardiology   Procedures:  Echo pend   Antibiotics:  Zosyn 1/1<<<<  azythro 1/1<<< (indicate start date, and stop date if known)  HPI/Subjective: alert  Objective: Filed Vitals:   06/11/13 0734  BP: 109/52  Pulse: 70  Temp: 97.4 F (36.3 C)  Resp: 15    Intake/Output Summary (Last 24 hours) at 06/11/13 0821 Last data filed at 06/11/13 0700  Gross per 24 hour  Intake   1190 ml  Output   2100 ml  Net   -910 ml   Filed Weights   06/07/13 1226 06/07/13 1711 06/08/13 0550  Weight: 81.647 kg (180 lb) 81.2 kg (179 lb 0.2 oz) 80.65 kg (177 lb 12.8 oz)    Exam:   General:  alert  Cardiovascular: s1,s2 rrr  Respiratory: LL crackles   Abdomen: soft, nt, nd   Musculoskeletal: BKA L   Data Reviewed: Basic Metabolic Panel:  Recent Labs Lab 06/07/13 1232 06/07/13 1250 06/07/13 1720 06/08/13 0547 06/09/13 0555 06/10/13 0600  NA 138 136*  --  146 146 144  K 4.5 4.2  --  3.3* 3.6* 3.5*  CL 101 100  --  111 111 111  CO2 24  --   --  21 22 24   GLUCOSE 112* 119*  --  121* 96 102*  BUN 15 15  --  10 8 8   CREATININE 1.05 1.10 0.85 0.80 0.81 0.75  CALCIUM 8.7  --   --  8.0* 7.9* 7.9*   Liver Function Tests: No results found for this basename: AST, ALT, ALKPHOS, BILITOT, PROT, ALBUMIN,  in the last 168 hours No results found for this basename: LIPASE, AMYLASE,  in the last 168 hours No results found for this basename: AMMONIA,  in the last 168 hours CBC:  Recent Labs Lab 06/07/13 1232 06/07/13 1250 06/07/13 1720 06/08/13 0547 06/09/13 0555 06/10/13 0600  WBC 10.7*  --  10.5 5.8 6.0 5.9  HGB 10.6* 11.6* 9.1* 9.1* 9.0* 8.9*  HCT 33.3* 34.0* 28.6* 29.5* 28.7* 28.0*  MCV 85.6  --  85.9 86.8 86.4 85.1  PLT 173  --  162 151 179 174   Cardiac Enzymes:  Recent Labs Lab 06/07/13 1239  TROPONINI <0.30   BNP (last 3 results)  Recent Labs  06/07/13 1720  PROBNP 121.5   CBG: No  results found for this basename: GLUCAP,  in the last 168 hours  Recent Results (from the past 240 hour(s))  CULTURE, BLOOD (ROUTINE X 2)     Status: None   Collection Time    06/07/13  3:35 PM      Result Value Range Status   Specimen Description BLOOD LEFT ANTECUBITAL   Final   Special Requests BOTTLES DRAWN AEROBIC AND ANAEROBIC 10CC   Final   Culture  Setup Time     Final   Value: 06/08/2013 01:31     Performed at [facility]   Culture     Final   Value:        BLOOD CULTURE RECEIVED NO GROWTH TO DATE CULTURE WILL BE HELD FOR 5 DAYS BEFORE ISSUING A FINAL NEGATIVE REPORT     Performed at [facility]   Report Status PENDING   Incomplete  CULTURE, BLOOD (ROUTINE X 2)     Status: None   Collection Time    06/07/13  3:50 PM      Result Value Range Status   Specimen Description BLOOD ARM RIGHT   Final   Special Requests BOTTLES DRAWN AEROBIC AND ANAEROBIC 10CC   Final   Culture  Setup Time     Final   Value: 06/08/2013 01:29     Performed at [facility]   Culture     Final   Value:        BLOOD CULTURE RECEIVED NO GROWTH TO DATE CULTURE WILL BE HELD FOR 5 DAYS BEFORE ISSUING A FINAL NEGATIVE REPORT     Performed at [facility]   Report Status PENDING   Incomplete  MRSA PCR SCREENING     Status: None   Collection Time    06/08/13  2:23 PM      Result Value Range Status   MRSA by PCR NEGATIVE  NEGATIVE Final   Comment:            The GeneXpert MRSA Assay (FDA     approved for NASAL specimens     only), is one component of a     comprehensive MRSA colonization     surveillance program. It is not     intended to diagnose MRSA     infection nor to guide or     monitor treatment for     MRSA infections.     Studies: No results found.  Scheduled Meds: . aspirin  325 mg Oral Daily  . azithromycin  500 mg Oral Daily  . buPROPion  100 mg Oral Daily  . emtricitabine-tenofovir  1 tablet Oral Daily  . enoxaparin (LOVENOX) injection  40 mg  Subcutaneous Q24H  . fludrocortisone  0.1 mg Oral  Daily  . FLUoxetine  40 mg Oral BID  . levothyroxine  50 mcg Oral QAC breakfast  . metoCLOPramide  5 mg Oral BID  . oxybutynin  10 mg Oral BID  . pantoprazole  40 mg Oral BID  . piperacillin-tazobactam (ZOSYN)  IV  3.375 g Intravenous Q8H  . pregabalin  75 mg Oral BID  . QUEtiapine  100 mg Oral QHS  . raltegravir  400 mg Oral BID  . senna-docusate  1 tablet Oral BID  . sucralfate  1 g Oral TID WC & HS  . tamsulosin  0.4 mg Oral BID  . traZODone  100 mg Oral QHS   Continuous Infusions:    Active Problems:   CAP (community acquired pneumonia)   Sepsis   Aspiration pneumonia   HIV (human immunodeficiency virus infection)   Pneumonia   Complete heart block    Time spent: >35 minutes     Esperanza Sheets  [facility] Pager [phone]. If 7PM-7AM, please contact night-coverage at [email or web address], password [email or web address] 06/11/2013, 8:21 AM  LOS: 4 days

## 2013-06-12 MED ORDER — FUROSEMIDE 40 MG PO TABS: 20.0000 mg | ORAL_TABLET | Freq: Every day | ORAL | Status: AC

## 2013-06-12 MED ORDER — LEVOFLOXACIN 500 MG PO TABS: 500.0000 mg | ORAL_TABLET | Freq: Every day | ORAL | Status: AC

## 2013-06-12 MED ORDER — DIPHENHYDRAMINE HCL 25 MG PO CAPS
25.0000 mg | ORAL_CAPSULE | ORAL | Status: DC | PRN
  Administered 2013-06-12: 25 mg via ORAL
  Filled 2013-06-12: qty 1

## 2013-06-12 MED ORDER — FUROSEMIDE 20 MG PO TABS
10.0000 mg | ORAL_TABLET | Freq: Every day | ORAL | Status: DC
  Administered 2013-06-12 – 2013-06-13 (×2): 10 mg via ORAL
  Filled 2013-06-12 (×2): qty 0.5

## 2013-06-12 MED ORDER — DIPHENOXYLATE-ATROPINE 2.5-0.025 MG PO TABS
2.0000 | ORAL_TABLET | Freq: Once | ORAL | Status: AC
  Administered 2013-06-12: 2 via ORAL
  Filled 2013-06-12: qty 2

## 2013-06-12 NOTE — Discharge Summary (Signed)
Physician Discharge Summary  [name] ZOX:[id number] DOB: 03/11/1951 DOA: 06/07/2013  PCP: No primary provider on file.  Admit date: 06/07/2013 Discharge date: 06/12/2013  Time spent: >35 minutes  Recommendations for Outpatient Follow-up:  F/u with PCP next week  Discharge Diagnoses:  Active Problems:   CAP (community acquired pneumonia)   Sepsis   Aspiration pneumonia   HIV (human immunodeficiency virus infection)   Pneumonia   Complete heart block   Discharge Condition: stable   Diet recommendation: heart healthy  Filed Weights   06/07/13 1226 06/07/13 1711 06/08/13 0550  Weight: 81.647 kg (180 lb) 81.2 kg (179 lb 0.2 oz) 80.65 kg (177 lb 12.8 oz)    History of present illness:  63 y.o. male with a past medical history of CHF and possibly AFib in 2005 but no valvular disease or CAD, h/o HIV, last CD4 count greater than 600 per patient several months ago, history of severe reflux leading to aspiration pneumonias in the past, status post fundoplication procedure performed in [place] this year, who is currently in the area visiting his parents. He presents to the emergency department today with complaints of cough, fever reporting a temperature of 103 at home this morning, green sputum production, and shortness of breath which has progressively worsened over the past 4 days  -patient noted to have complete HB in the hospital   Hospital Course:  1. Sepsis/pneumonia evidenced by hypotension; Chest x-ray showing infiltrate involving left lower lung  -improved on IV atx, IVF prn Bronchodilators; blood c/s NTD; change to PO atx on 1/7  2. Complete heart block; transient, symptomatic in the setting of coughing likely vagal response;  -no new episodes; per cardiology no indication for PPM at this time;   3. History of CHF and possibly AFib per patient  - stable; echo: LVEF 55%; no s/s of fluid overload; resume PO diuretics 1/6   4. HIV. He states having last CD4 count greater  than 600 several months ago. Will continue anti-retroviral therapy with Truvada and Raltegravir.  5. Severe gastroesophageal reflux disease. Continue Protonix 40 mg by mouth twice a day and Carafate. Status post fundoplication.  7. Anemia; no s/s of acute bleeding; close monitor hg;   Patient has improved; plan to d/c home on 1/7 if stable;     Procedures:  echo (i.e. Studies not automatically included, echos, thoracentesis, etc; not x-rays)  Consultations:  cardiology  Discharge Exam: Filed Vitals:   06/12/13 0910  BP: 90/54  Pulse: 79  Temp: 97.3 F (36.3 C)  Resp: 18    General: alert Cardiovascular: s1,s2 rrr Respiratory: few crackles LL  Discharge Instructions  Discharge Orders   Future Orders Complete By Expires   Diet - low sodium heart healthy  As directed    Discharge instructions  As directed    Comments:     Please follow up with primary care doctor in 1 week   Increase activity slowly  As directed        Medication List         aspirin 325 MG tablet  Take 325 mg by mouth daily.     buPROPion 100 MG tablet  Commonly known as:  WELLBUTRIN  Take 100 mg by mouth every morning.     clotrimazole-betamethasone cream  Commonly known as:  LOTRISONE  Apply 1 application topically as needed.     COMBIVENT RESPIMAT 20-100 MCG/ACT Aers respimat  Generic drug:  Ipratropium-Albuterol  Inhale 1 puff into the lungs 4 (four) times  daily.     diphenoxylate-atropine 2.5-0.025 MG per tablet  Commonly known as:  LOMOTIL  Take 1 tablet by mouth 3 (three) times daily as needed for diarrhea or loose stools (sometimes takes more than 3 times daily).     emtricitabine-tenofovir 200-300 MG per tablet  Commonly known as:  TRUVADA  Take 1 tablet by mouth daily.     EPIPEN 2-PAK 0.3 mg/0.3 mL Soaj injection  Generic drug:  EPINEPHrine  Inject 0.3 mg into the muscle once as needed (anaphylaxis).     fludrocortisone 0.1 MG tablet  Commonly known as:  FLORINEF  Take  0.1 mg by mouth daily.     FLUoxetine 40 MG capsule  Commonly known as:  PROZAC  Take 40 mg by mouth 2 (two) times daily.     folic acid 1 MG tablet  Commonly known as:  FOLVITE  Take 1 mg by mouth daily.     furosemide 40 MG tablet  Commonly known as:  LASIX  Take 0.5 tablets (20 mg total) by mouth daily.     HM VITAMIN B100 COMPLEX PO  Take 1 tablet by mouth every morning.     levofloxacin 500 MG tablet  Commonly known as:  LEVAQUIN  Take 1 tablet (500 mg total) by mouth daily.     levothyroxine 50 MCG tablet  Commonly known as:  SYNTHROID, LEVOTHROID  Take 50 mcg by mouth daily before breakfast.     loratadine 10 MG tablet  Commonly known as:  CLARITIN  Take 10 mg by mouth daily.     Melatonin 3 MG Tabs  Take 1 tablet by mouth at bedtime.     metoCLOPramide 5 MG tablet  Commonly known as:  REGLAN  Take 5 mg by mouth 2 (two) times daily.     MUCINEX PO  Take 1 tablet by mouth daily. Chest congestion     ondansetron 4 MG tablet  Commonly known as:  ZOFRAN  Take 8 mg by mouth every 8 (eight) hours as needed for nausea or vomiting.     oxybutynin 10 MG 24 hr tablet  Commonly known as:  DITROPAN-XL  Take 10 mg by mouth 2 (two) times daily.     oxyCODONE 15 MG immediate release tablet  Commonly known as:  ROXICODONE  Take 15 mg by mouth every 8 (eight) hours as needed for pain (breakthrough pain).     OXYCONTIN 80 mg T12a 12 hr tablet  Generic drug:  OxyCODONE  Take 80 mg by mouth 2 (two) times daily.     pantoprazole 40 MG tablet  Commonly known as:  PROTONIX  Take 40 mg by mouth 2 (two) times daily.     potassium chloride SA 20 MEQ tablet  Commonly known as:  K-DUR,KLOR-CON  Take 20 mEq by mouth 2 (two) times daily.     pregabalin 75 MG capsule  Commonly known as:  LYRICA  Take 75 mg by mouth 2 (two) times daily.     QUEtiapine 100 MG tablet  Commonly known as:  SEROQUEL  Take 100 mg by mouth at bedtime.     raltegravir 400 MG tablet  Commonly  known as:  ISENTRESS  Take 400 mg by mouth 2 (two) times daily.     ranitidine 150 MG tablet  Commonly known as:  ZANTAC  Take 150 mg by mouth 2 (two) times daily.     sucralfate 1 GM/10ML suspension  Commonly known as:  CARAFATE  Take 1 g by  mouth 4 (four) times daily as needed.     tamsulosin 0.4 MG Caps capsule  Commonly known as:  FLOMAX  Take 0.4 mg by mouth 2 (two) times daily.     traZODone 100 MG tablet  Commonly known as:  DESYREL  Take 100 mg by mouth at bedtime.     Vitamin D (Ergocalciferol) 50000 UNITS Caps capsule  Commonly known as:  DRISDOL  Take 50,000 Units by mouth every 7 (seven) days. Takes on fridays       Allergies  Allergen Reactions  . Bee Venom     anaphalaxis       Follow-up Information   Follow up with [facility]     In 1 week.   Contact information:   [address] [place] [address] [phone]       The results of significant diagnostics from this hospitalization (including imaging, microbiology, ancillary and laboratory) are listed below for reference.    Significant Diagnostic Studies: Dg Chest 2 View  06/07/2013   CLINICAL DATA:  Fever and cough.  EXAM: CHEST  2 VIEW  COMPARISON:  None.  FINDINGS: Two views of the chest demonstrate mild elevation of the right hemidiaphragm. There are streaky densities at the left lung base which could an infectious process. Few densities near the right costophrenic angle may represent atelectasis or scarring. Surgical clips in the right infrahilar region. There are streaky densities in the right superior hilar region that may represent overlying bone structures but nonspecific. No definite pleural effusions.  IMPRESSION: Streaky densities at the left lung base are concerning for an acute infectious or inflammatory process but difficult to exclude chronic changes at this location.  Postoperative changes in the right hemithorax with mild elevation of the right  hemidiaphragm. Streaky densities at the right costophrenic angle and right suprahilar region are nonspecific.  Recommend short-term followup to evaluate for resolution of the densities at the left lung base. The patient may need a chest CT to follow the streaky densities in the right lung.   Electronically Signed   By: Richarda Overlie M.D.   On: 06/07/2013 13:37    Microbiology: Recent Results (from the past 240 hour(s))  CULTURE, BLOOD (ROUTINE X 2)     Status: None   Collection Time    06/07/13  3:35 PM      Result Value Range Status   Specimen Description BLOOD LEFT ANTECUBITAL   Final   Special Requests BOTTLES DRAWN AEROBIC AND ANAEROBIC 10CC   Final   Culture  Setup Time     Final   Value: 06/08/2013 01:31     Performed at [facility]   Culture     Final   Value:        BLOOD CULTURE RECEIVED NO GROWTH TO DATE CULTURE WILL BE HELD FOR 5 DAYS BEFORE ISSUING A FINAL NEGATIVE REPORT     Performed at [facility]   Report Status PENDING   Incomplete  CULTURE, BLOOD (ROUTINE X 2)     Status: None   Collection Time    06/07/13  3:50 PM      Result Value Range Status   Specimen Description BLOOD ARM RIGHT   Final   Special Requests BOTTLES DRAWN AEROBIC AND ANAEROBIC 10CC   Final   Culture  Setup Time     Final   Value: 06/08/2013 01:29     Performed at [facility]  Final   Value:        BLOOD CULTURE RECEIVED NO GROWTH TO DATE CULTURE WILL BE HELD FOR 5 DAYS BEFORE ISSUING A FINAL NEGATIVE REPORT     Performed at [facility]   Report Status PENDING   Incomplete  MRSA PCR SCREENING     Status: None   Collection Time    06/08/13  2:23 PM      Result Value Range Status   MRSA by PCR NEGATIVE  NEGATIVE Final   Comment:            The GeneXpert MRSA Assay (FDA     approved for NASAL specimens     only), is one component of a     comprehensive MRSA colonization     surveillance program. It is not     intended to diagnose MRSA      infection nor to guide or     monitor treatment for     MRSA infections.     Labs: Basic Metabolic Panel:  Recent Labs Lab 06/07/13 1232 06/07/13 1250 06/07/13 1720 06/08/13 0547 06/09/13 0555 06/10/13 0600  NA 138 136*  --  146 146 144  K 4.5 4.2  --  3.3* 3.6* 3.5*  CL 101 100  --  111 111 111  CO2 24  --   --  21 22 24   GLUCOSE 112* 119*  --  121* 96 102*  BUN 15 15  --  10 8 8   CREATININE 1.05 1.10 0.85 0.80 0.81 0.75  CALCIUM 8.7  --   --  8.0* 7.9* 7.9*   Liver Function Tests: No results found for this basename: AST, ALT, ALKPHOS, BILITOT, PROT, ALBUMIN,  in the last 168 hours No results found for this basename: LIPASE, AMYLASE,  in the last 168 hours No results found for this basename: AMMONIA,  in the last 168 hours CBC:  Recent Labs Lab 06/07/13 1232 06/07/13 1250 06/07/13 1720 06/08/13 0547 06/09/13 0555 06/10/13 0600  WBC 10.7*  --  10.5 5.8 6.0 5.9  HGB 10.6* 11.6* 9.1* 9.1* 9.0* 8.9*  HCT 33.3* 34.0* 28.6* 29.5* 28.7* 28.0*  MCV 85.6  --  85.9 86.8 86.4 85.1  PLT 173  --  162 151 179 174   Cardiac Enzymes:  Recent Labs Lab 06/07/13 1239  TROPONINI <0.30   BNP: BNP (last 3 results)  Recent Labs  06/07/13 1720  PROBNP 121.5   CBG: No results found for this basename: GLUCAP,  in the last 168 hours     Signed:  Jonette Mate N  [facility] 06/12/2013, 1:14 PM

## 2013-06-12 NOTE — Progress Notes (Signed)
OT Cancellation Note  Patient Details Name: Heber CarolinaMichael Crate MRN: [id number] DOB: 02/05/1951   Cancelled Treatment:    Reason Eval/Treat Not Completed: OT screened, no needs identified, will sign off  Anoushka Divito 06/12/2013, 8:59 AM

## 2013-06-12 NOTE — Progress Notes (Addendum)
[facility] PROGRESS NOTE  [name] ZOX:[id number] DOB: 11-01-1950 DOA: 06/07/2013 PCP: No primary provider on file.  Assessment/Plan: 63 y.o. male with a past medical history of CHF and possibly AFib in 2005 but no valvular disease or CAD, h/o HIV, last CD4 count greater than 600 per patient several months ago, history of severe reflux leading to aspiration pneumonias in the past, status post fundoplication procedure performed in [place] this year, who is currently in the area visiting his parents. He presents to the emergency department today with complaints of cough, fever reporting a temperature of 103 at home this morning, green sputum production, and shortness of breath which has progressively worsened over the past 4 days -patient noted to have complete HB in the hospital    1. Sepsis/pneumonia evidenced by hypotension; Chest x-ray showing infiltrate involving left lower lung -improving on IV atx, IVF prn  Bronchodilators; blood c/s NTD; plan to change to PO atx on 1/7  2. Complete heart block; transient, symptomatic in the setting of coughing likely vagal response;  -no new episodes; per cardiology no indication for PPM at this time;  monitor SDU; try robitussin AC for cough   3. History of CHF and possibly AFib per patient  - cardiology following; echo: LVEF 55%; no s/s of fluid overload; resume PO diuretics 1/6 if BP allows   4. HIV. He states having last CD4 count greater than 600 several months ago. Will continue anti-retroviral therapy with Truvada and Raltegravir.   5. Severe gastroesophageal reflux disease. Continue Protonix 40 mg by mouth twice a day and Carafate. Status post fundoplication.  7. Anemia; no s/s of acute bleeding; close monitor hg; check iron profile   TF to RNF on 1/5; D/c plans possible 24-48 hours    Code Status: full Family Communication: son at the bedside (indicate person spoken with, relationship, and if by phone, the  number) Disposition Plan: home when improved    Consultants:  Cardiology   Procedures:  Echo pend   Antibiotics:  Zosyn 1/1<<<<  azythro 1/1<<< (indicate start date, and stop date if known)  HPI/Subjective: alert  Objective: Filed Vitals:   06/12/13 0508  BP: 102/61  Pulse: 66  Temp: 98.2 F (36.8 C)  Resp: 20    Intake/Output Summary (Last 24 hours) at 06/12/13 0846 Last data filed at 06/12/13 0700  Gross per 24 hour  Intake    290 ml  Output   1100 ml  Net   -810 ml   Filed Weights   06/07/13 1226 06/07/13 1711 06/08/13 0550  Weight: 81.647 kg (180 lb) 81.2 kg (179 lb 0.2 oz) 80.65 kg (177 lb 12.8 oz)    Exam:   General:  alert  Cardiovascular: s1,s2 rrr  Respiratory: LL crackles   Abdomen: soft, nt, nd   Musculoskeletal: BKA L   Data Reviewed: Basic Metabolic Panel:  Recent Labs Lab 06/07/13 1232 06/07/13 1250 06/07/13 1720 06/08/13 0547 06/09/13 0555 06/10/13 0600  NA 138 136*  --  146 146 144  K 4.5 4.2  --  3.3* 3.6* 3.5*  CL 101 100  --  111 111 111  CO2 24  --   --  21 22 24   GLUCOSE 112* 119*  --  121* 96 102*  BUN 15 15  --  10 8 8   CREATININE 1.05 1.10 0.85 0.80 0.81 0.75  CALCIUM 8.7  --   --  8.0* 7.9* 7.9*   Liver Function Tests: No results found for this basename:  AST, ALT, ALKPHOS, BILITOT, PROT, ALBUMIN,  in the last 168 hours No results found for this basename: LIPASE, AMYLASE,  in the last 168 hours No results found for this basename: AMMONIA,  in the last 168 hours CBC:  Recent Labs Lab 06/07/13 1232 06/07/13 1250 06/07/13 1720 06/08/13 0547 06/09/13 0555 06/10/13 0600  WBC 10.7*  --  10.5 5.8 6.0 5.9  HGB 10.6* 11.6* 9.1* 9.1* 9.0* 8.9*  HCT 33.3* 34.0* 28.6* 29.5* 28.7* 28.0*  MCV 85.6  --  85.9 86.8 86.4 85.1  PLT 173  --  162 151 179 174   Cardiac Enzymes:  Recent Labs Lab 06/07/13 1239  TROPONINI <0.30   BNP (last 3 results)  Recent Labs  06/07/13 1720  PROBNP 121.5   CBG: No  results found for this basename: GLUCAP,  in the last 168 hours  Recent Results (from the past 240 hour(s))  CULTURE, BLOOD (ROUTINE X 2)     Status: None   Collection Time    06/07/13  3:35 PM      Result Value Range Status   Specimen Description BLOOD LEFT ANTECUBITAL   Final   Special Requests BOTTLES DRAWN AEROBIC AND ANAEROBIC 10CC   Final   Culture  Setup Time     Final   Value: 06/08/2013 01:31     [facility]formed [facility]   Culture     Final   Value:        BLOOD CULTURE RECEIVED NO GROWTH TO DATE CULTURE WILL BE HELD FOR 5 DAYS BEFORE ISSUING A FINAL NEGATIVE REPORT     Performed at [facility]   Report Status PENDING   Incomplete  CULTURE, BLOOD (ROUTINE X 2)     Status: None   Collection Time    06/07/13  3:50 PM      Result Value Range Status   Specimen Description BLOOD ARM RIGHT   Final   Special Requests BOTTLES DRAWN AEROBIC AND ANAEROBIC 10CC   Final   Culture  Setup Time     Final   Value: 06/08/2013 01:29     Performed at [facility]   Culture     Final   Value:        BLOOD CULTURE RECEIVED NO GROWTH TO DATE CULTURE WILL BE HELD FOR 5 DAYS BEFORE ISSUING A FINAL NEGATIVE REPORT     Performed at [facility]   Report Status PENDING   Incomplete  MRSA PCR SCREENING     Status: None   Collection Time    06/08/13  2:23 PM      Result Value Range Status   MRSA by PCR NEGATIVE  NEGATIVE Final   Comment:            The GeneXpert MRSA Assay (FDA     approved for NASAL specimens     only), is one component of a     comprehensive MRSA colonization     surveillance program. It is not     intended to diagnose MRSA     infection nor to guide or     monitor treatment for     MRSA infections.     Studies: No results found.  Scheduled Meds: . aspirin  325 mg Oral Daily  . azithromycin  500 mg Oral Daily  . bisacodyl  10 mg Rectal Once  . buPROPion  100 mg Oral Daily  . emtricitabine-tenofovir  1 tablet Oral Daily  .  enoxaparin (LOVENOX) injection  40 mg Subcutaneous Q24H  . fludrocortisone  0.1 mg Oral Daily  . FLUoxetine  40 mg Oral BID  . levothyroxine  50 mcg Oral QAC breakfast  . metoCLOPramide  5 mg Oral BID  . oxybutynin  10 mg Oral BID  . pantoprazole  40 mg Oral BID  . piperacillin-tazobactam (ZOSYN)  IV  3.375 g Intravenous Q8H  . pregabalin  75 mg Oral BID  . QUEtiapine  100 mg Oral QHS  . raltegravir  400 mg Oral BID  . senna-docusate  1 tablet Oral BID  . sucralfate  1 g Oral TID WC & HS  . tamsulosin  0.4 mg Oral BID  . traZODone  100 mg Oral QHS   Continuous Infusions:    Active Problems:   CAP (community acquired pneumonia)   Sepsis   Aspiration pneumonia   HIV (human immunodeficiency virus infection)   Pneumonia   Complete heart block    Time spent: >35 minutes     Esperanza Sheets  [facility] Pager [phone]. If 7PM-7AM, please contact night-coverage at [email or web address], password [facility] 06/12/2013, 8:46 AM  LOS: 5 days

## 2013-06-13 NOTE — Progress Notes (Signed)
D/c instructions reviewed with pt. Copy of instructions and scripts given to pt. Pt d/c'd via wheelchair with belongings, escorted by unit staff. Family picked pt up at entrance.

## 2013-06-14 LAB — CULTURE, BLOOD (ROUTINE X 2)
Culture: NO GROWTH
Culture: NO GROWTH

## 2014-12-23 IMAGING — CR DG CHEST 2V
2 series · 2 of 2 positions shown · non-contrast
Comparison: None.

CLINICAL DATA: Fever and cough.

EXAM:
CHEST  2 VIEW

[w chest pa]
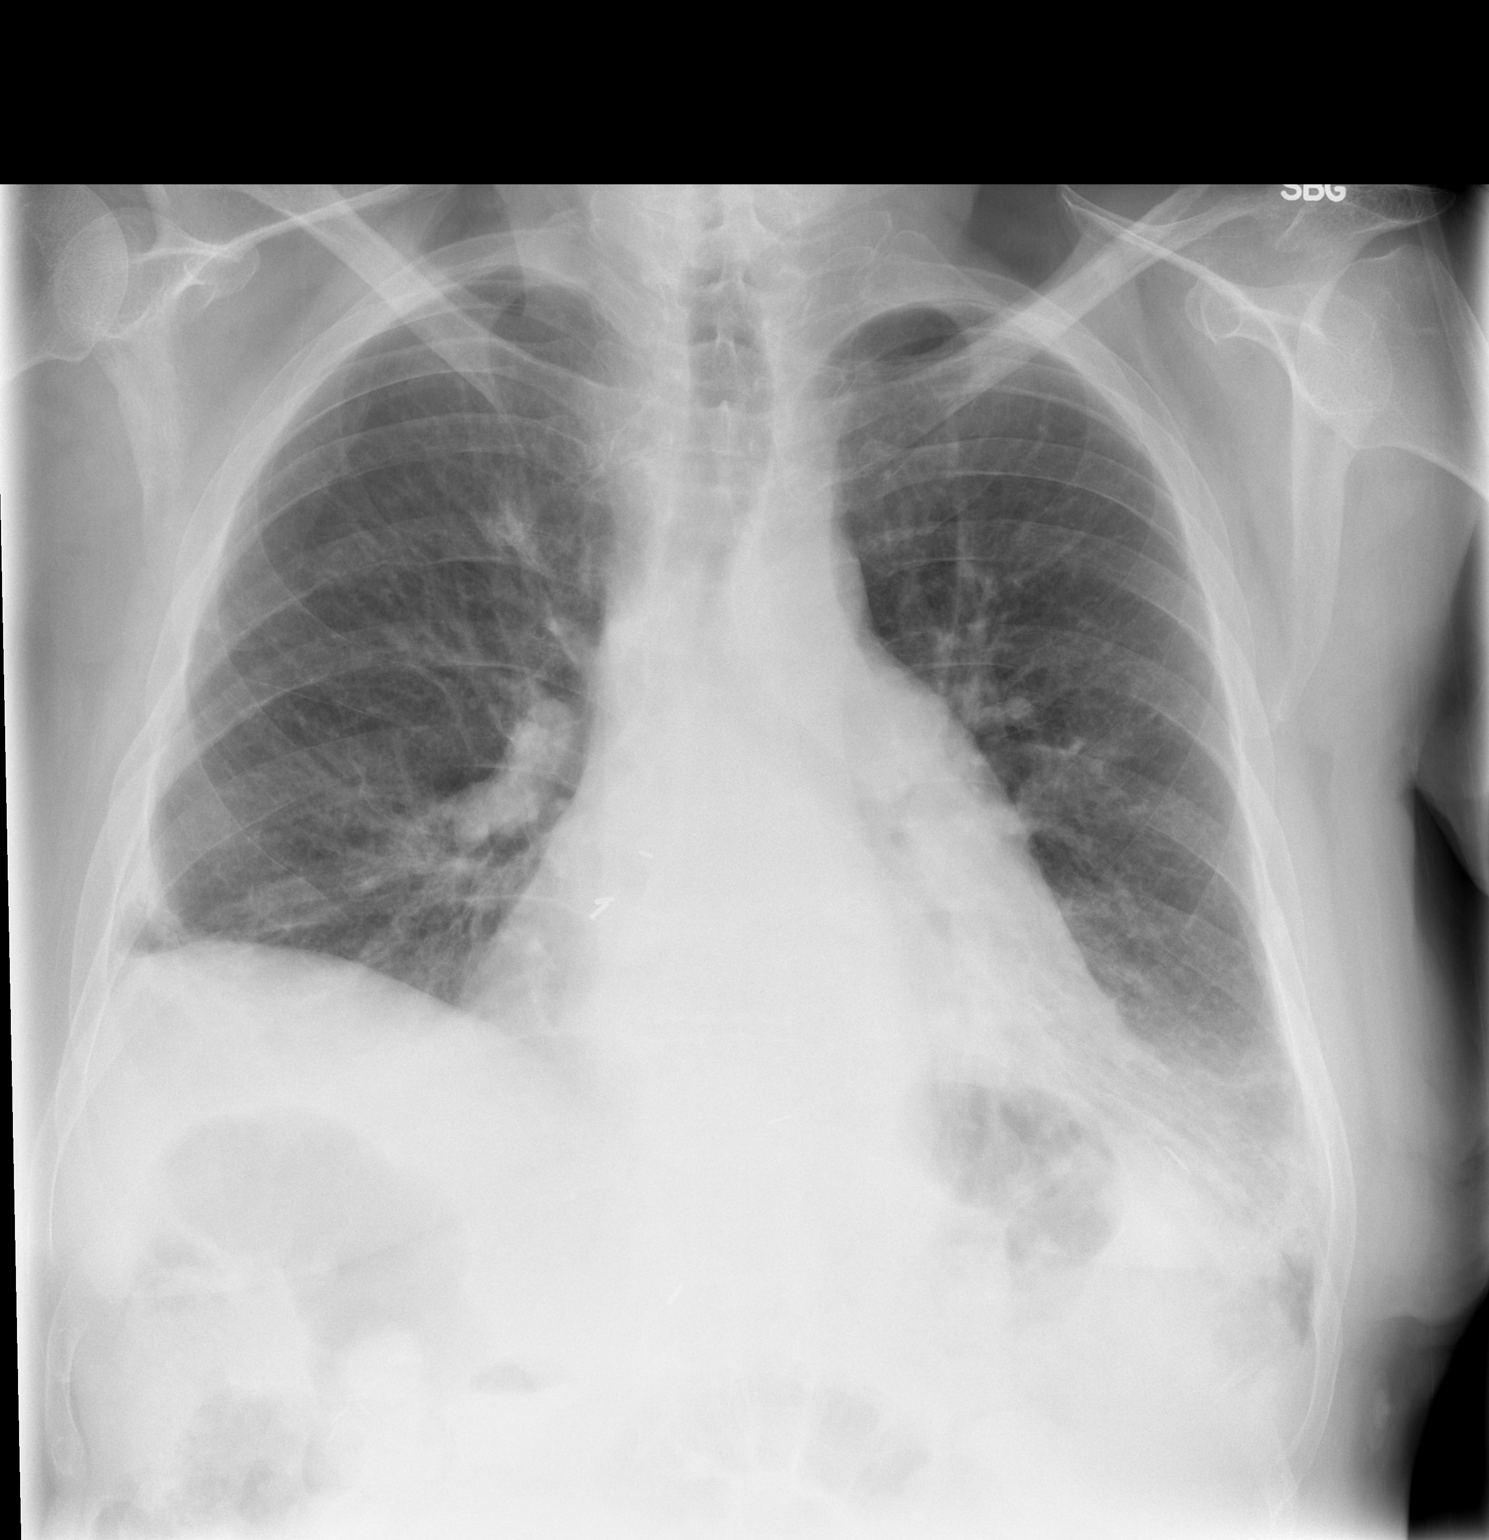

[w chest lat]
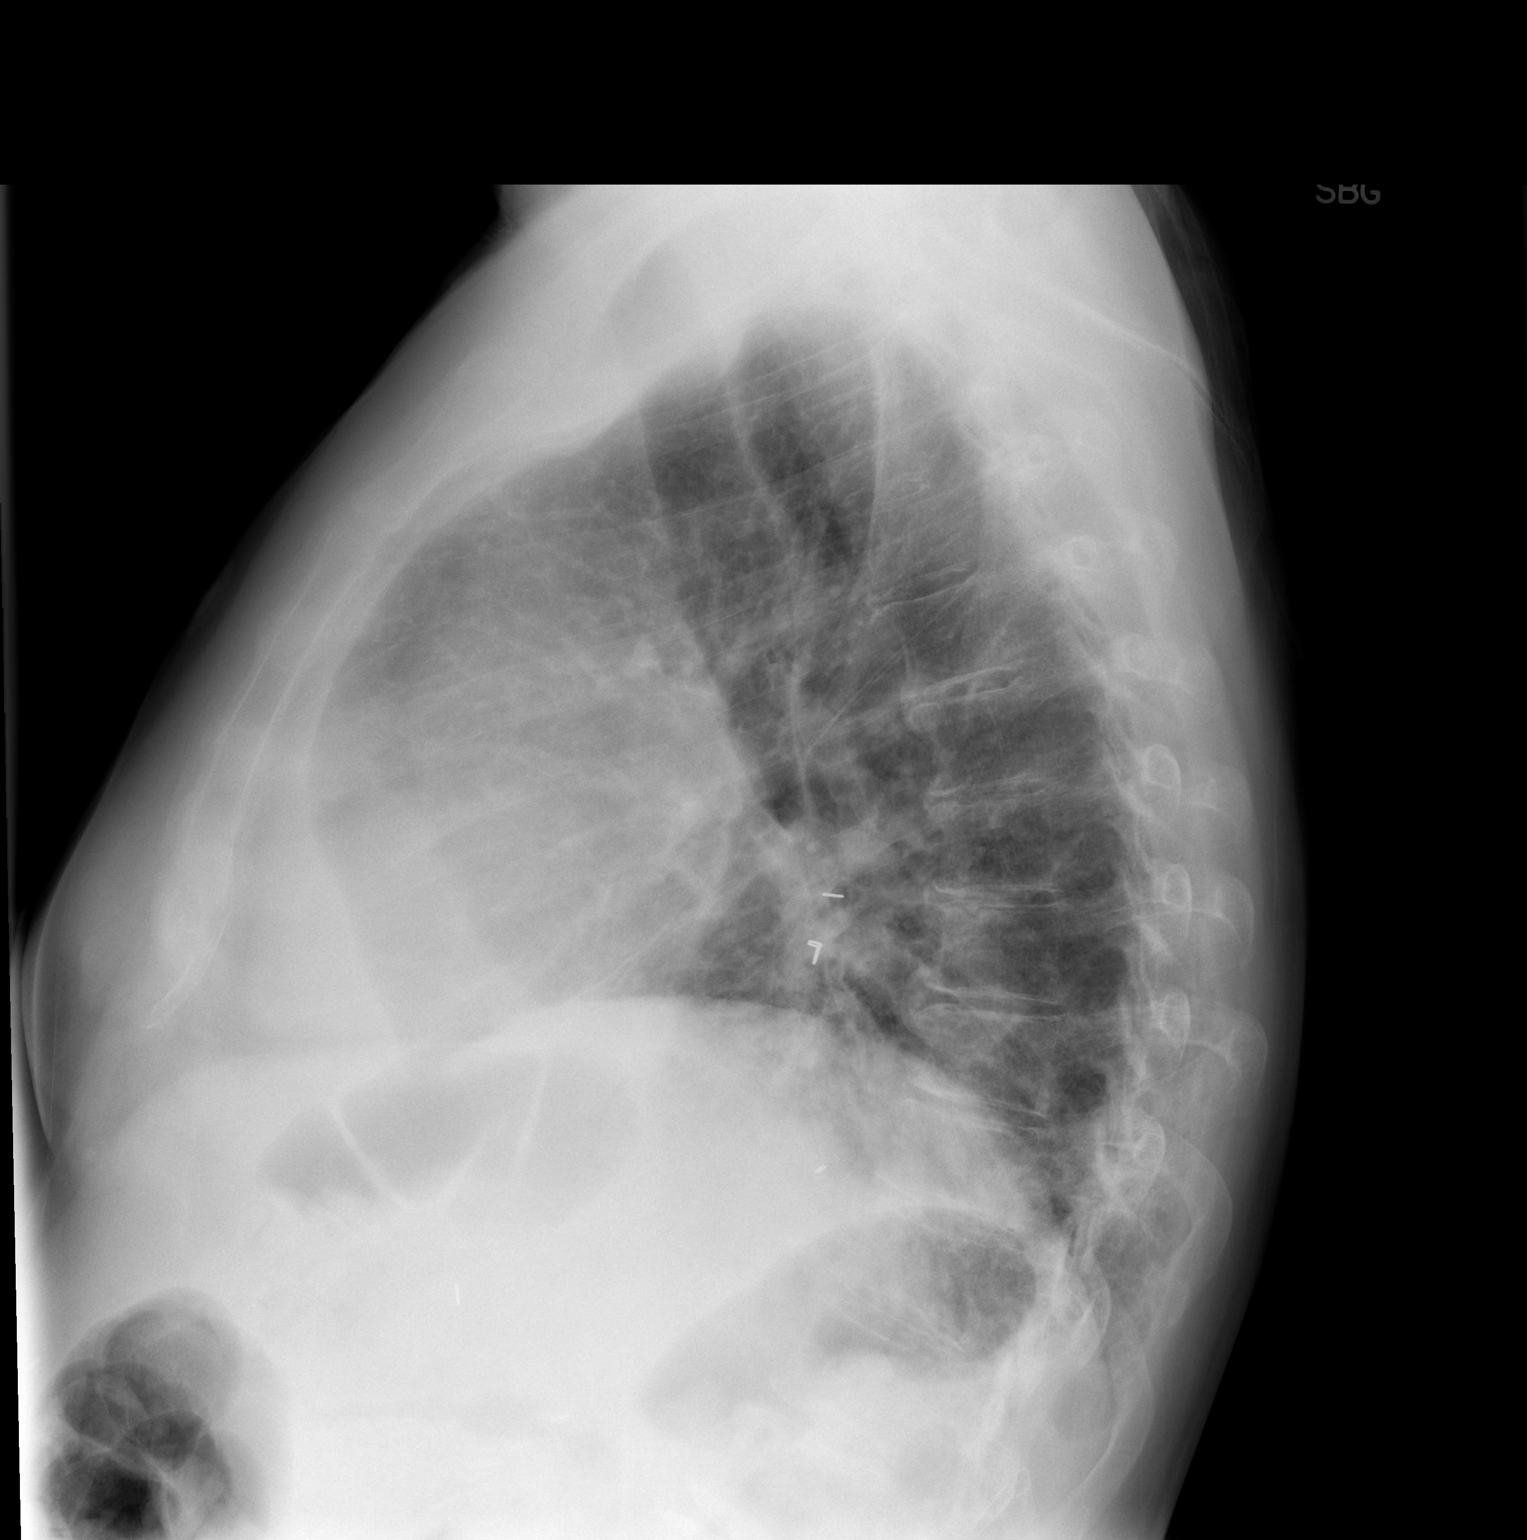

[2 of 2 positions shown; findings below may reference images not displayed]

FINDINGS: Two views of the chest demonstrate mild elevation of the right
hemidiaphragm. There are streaky densities at the left lung base
which could an infectious process. Few densities near the right
costophrenic angle may represent atelectasis or scarring. Surgical
clips in the right infrahilar region. There are streaky densities in
the right superior hilar region that may represent overlying bone
structures but nonspecific. No definite pleural effusions.
IMPRESSION: Streaky densities at the left lung base are concerning for an acute
infectious or inflammatory process but difficult to exclude chronic
changes at this location.

Postoperative changes in the right hemithorax with mild elevation of
the right hemidiaphragm. Streaky densities at the right costophrenic
angle and right suprahilar region are nonspecific.

Recommend short-term followup to evaluate for resolution of the
densities at the left lung base. The patient may need a chest CT to
follow the streaky densities in the right lung.

## 2022-12-06 DEATH — deceased
